# Patient Record
Sex: Female | Born: 1985 | Race: White | Hispanic: No | Marital: Married | Smoking: Never smoker
Health system: Southern US, Community
[De-identification: ages and names within clinical notes are randomized; demographics above are authoritative.]

## PROBLEM LIST (undated history)

## (undated) ENCOUNTER — Inpatient Hospital Stay (HOSPITAL_COMMUNITY): Payer: Self-pay

## (undated) DIAGNOSIS — O0990 Supervision of high risk pregnancy, unspecified, unspecified trimester: Secondary | ICD-10-CM

## (undated) DIAGNOSIS — O24419 Gestational diabetes mellitus in pregnancy, unspecified control: Secondary | ICD-10-CM

## (undated) DIAGNOSIS — E039 Hypothyroidism, unspecified: Secondary | ICD-10-CM

## (undated) DIAGNOSIS — O2620 Pregnancy care for patient with recurrent pregnancy loss, unspecified trimester: Secondary | ICD-10-CM

## (undated) HISTORY — DX: Supervision of high risk pregnancy, unspecified, unspecified trimester: O09.90

## (undated) HISTORY — DX: Pregnancy care for patient with recurrent pregnancy loss, unspecified trimester: O26.20

## (undated) HISTORY — PX: DILATION AND CURETTAGE OF UTERUS: SHX78

---

## 2015-02-23 ENCOUNTER — Encounter (HOSPITAL_COMMUNITY): Payer: Self-pay | Admitting: Emergency Medicine

## 2015-02-23 ENCOUNTER — Emergency Department (HOSPITAL_COMMUNITY)
Admission: EM | Admit: 2015-02-23 | Discharge: 2015-02-23 | Disposition: A | Discharge status: Home / Self Care | Payer: BLUE CROSS/BLUE SHIELD | Source: Home / Self Care | Attending: Family Medicine | Admitting: Family Medicine

## 2015-02-23 DIAGNOSIS — J309 Allergic rhinitis, unspecified: Secondary | ICD-10-CM

## 2015-02-23 MED ORDER — FLUTICASONE PROPIONATE 50 MCG/ACT NA SUSP: 2.0000 | Freq: Two times a day (BID) | NASAL | Status: DC

## 2015-02-23 MED ORDER — FEXOFENADINE-PSEUDOEPHED ER 60-120 MG PO TB12: 1.0000 | ORAL_TABLET | Freq: Two times a day (BID) | ORAL | Status: DC

## 2015-02-23 NOTE — ED Provider Notes (Signed)
CSN: [id number]     Arrival date & time 02/23/15  1420 History   First MD Initiated Contact with Patient 02/23/15 1541     Chief Complaint  Patient presents with  . Allergies   (Consider location/radiation/quality/duration/timing/severity/associated sxs/prior Treatment) HPI      29 year old female presents for evaluation of "sneezing problems." Starting 2 weeks ago she has sneezing and stuffy nose. She denies any pain or any cough. She has not taken any medications for this problem. No systemic symptoms  History reviewed. No pertinent past medical history. History reviewed. No pertinent past surgical history. No family history on file. History  Substance Use Topics  . Smoking status: Not on file  . Smokeless tobacco: Not on file  . Alcohol Use: No   OB History    No data available     Review of Systems  HENT: Positive for congestion and sneezing.   All other systems reviewed and are negative.   Allergies  Review of patient's allergies indicates no known allergies.  Home Medications   Prior to Admission medications   Medication Sig Start Date End Date Taking? Authorizing Provider  fexofenadine-pseudoephedrine (ALLEGRA-D) 60-120 MG per tablet Take 1 tablet by mouth every 12 (twelve) hours. 02/23/15   Adrian Blackwater Egidio Lofgren, PA-C  fluticasone (FLONASE) 50 MCG/ACT nasal spray Place 2 sprays into both nostrils 2 (two) times daily. Decrease to 2 sprays/nostril daily after 5 days 02/23/15   Graylon Good, PA-C   BP 112/85 mmHg  Pulse 84  Temp(Src) 97.9 F (36.6 C) (Oral)  Resp 16  SpO2 96%  LMP 02/07/2015 Physical Exam  Constitutional: She is oriented to person, place, and time. Vital signs are normal. She appears well-developed and well-nourished. No distress.  HENT:  Head: Normocephalic and atraumatic.  Right Ear: External ear normal.  Left Ear: External ear normal.  Nose: Mucosal edema present. Right sinus exhibits no maxillary sinus tenderness and no frontal sinus tenderness.  Left sinus exhibits no maxillary sinus tenderness and no frontal sinus tenderness.  Mouth/Throat: Oropharynx is clear and moist. No oropharyngeal exudate.  Neck: Normal range of motion. Neck supple.  Pulmonary/Chest: Effort normal. No respiratory distress.  Neurological: She is alert and oriented to person, place, and time. She has normal strength. Coordination normal.  Skin: Skin is warm and dry. No rash noted. She is not diaphoretic.  Psychiatric: She has a normal mood and affect. Judgment normal.  Nursing note and vitals reviewed.   ED Course  Procedures (including critical care time) Labs Review Labs Reviewed - No data to display  Imaging Review No results found.   MDM   1. Allergic rhinitis, unspecified allergic rhinitis type    Treat with Flonase and Allegra-D. continue the Flonase for a few months. Follow-up when necessary  Meds ordered this encounter  Medications  . fexofenadine-pseudoephedrine (ALLEGRA-D) 60-120 MG per tablet    Sig: Take 1 tablet by mouth every 12 (twelve) hours.    Dispense:  30 tablet    Refill:  0    Order Specific Question:  Supervising Provider    Answer:  Linna Hoff [phone]  . fluticasone (FLONASE) 50 MCG/ACT nasal spray    Sig: Place 2 sprays into both nostrils 2 (two) times daily. Decrease to 2 sprays/nostril daily after 5 days    Dispense:  16 g    Refill:  2    Order Specific Question:  Supervising Provider    Answer:  Bradd Canary D [[id number]]     Earna Coder  Myrtie NeitherH Leniyah Martell, PA-C 02/23/15 1636

## 2015-02-23 NOTE — Discharge Instructions (Signed)

## 2015-02-23 NOTE — ED Notes (Signed)
Sneezing, stuffy nose for 2 weeks.  Denies cough, denies fever, denies sore throat, denies ear pain

## 2015-03-07 ENCOUNTER — Other Ambulatory Visit (HOSPITAL_COMMUNITY): Payer: Self-pay | Admitting: Obstetrics and Gynecology

## 2015-03-07 DIAGNOSIS — N979 Female infertility, unspecified: Secondary | ICD-10-CM

## 2015-03-14 ENCOUNTER — Ambulatory Visit (HOSPITAL_COMMUNITY)
Admission: RE | Admit: 2015-03-14 | Discharge: 2015-03-14 | Disposition: A | Discharge status: Home / Self Care | Payer: BLUE CROSS/BLUE SHIELD | Source: Ambulatory Visit | Attending: Obstetrics and Gynecology | Admitting: Obstetrics and Gynecology

## 2015-03-14 DIAGNOSIS — N979 Female infertility, unspecified: Secondary | ICD-10-CM | POA: Diagnosis present

## 2015-03-14 MED ORDER — IOHEXOL 300 MG/ML  SOLN
30.0000 mL | Freq: Once | INTRAMUSCULAR | Status: AC | PRN
Start: 2015-03-14 — End: 2015-03-14
  Administered 2015-03-14: 30 mL

## 2015-06-17 ENCOUNTER — Other Ambulatory Visit (HOSPITAL_COMMUNITY): Payer: Self-pay | Admitting: Obstetrics & Gynecology

## 2015-06-17 DIAGNOSIS — N979 Female infertility, unspecified: Secondary | ICD-10-CM

## 2015-06-18 ENCOUNTER — Other Ambulatory Visit (HOSPITAL_COMMUNITY): Payer: Self-pay | Admitting: Obstetrics & Gynecology

## 2015-06-18 ENCOUNTER — Ambulatory Visit (HOSPITAL_COMMUNITY)
Admission: RE | Admit: 2015-06-18 | Discharge: 2015-06-18 | Disposition: A | Discharge status: Home / Self Care | Payer: BLUE CROSS/BLUE SHIELD | Source: Ambulatory Visit | Attending: Obstetrics & Gynecology | Admitting: Obstetrics & Gynecology

## 2015-06-18 DIAGNOSIS — N979 Female infertility, unspecified: Secondary | ICD-10-CM | POA: Insufficient documentation

## 2015-06-19 ENCOUNTER — Ambulatory Visit (HOSPITAL_COMMUNITY)
Admission: RE | Admit: 2015-06-19 | Discharge: 2015-06-19 | Disposition: A | Discharge status: Home / Self Care | Payer: BLUE CROSS/BLUE SHIELD | Source: Ambulatory Visit | Attending: Obstetrics & Gynecology | Admitting: Obstetrics & Gynecology

## 2015-06-19 DIAGNOSIS — N979 Female infertility, unspecified: Secondary | ICD-10-CM | POA: Diagnosis not present

## 2015-06-20 ENCOUNTER — Ambulatory Visit (HOSPITAL_COMMUNITY)
Admission: RE | Admit: 2015-06-20 | Discharge: 2015-06-20 | Disposition: A | Discharge status: Home / Self Care | Payer: BLUE CROSS/BLUE SHIELD | Source: Ambulatory Visit | Attending: Obstetrics and Gynecology | Admitting: Obstetrics and Gynecology

## 2015-06-20 ENCOUNTER — Other Ambulatory Visit: Payer: Self-pay

## 2015-06-20 DIAGNOSIS — N970 Female infertility associated with anovulation: Secondary | ICD-10-CM

## 2016-02-11 LAB — OB RESULTS CONSOLE GC/CHLAMYDIA
Chlamydia: NEGATIVE
Gonorrhea: NEGATIVE

## 2016-02-11 LAB — OB RESULTS CONSOLE ABO/RH: RH TYPE: POSITIVE

## 2016-02-11 LAB — OB RESULTS CONSOLE HGB/HCT, BLOOD
HCT: 36 %
Hemoglobin: 11.9 g/dL

## 2016-02-11 LAB — OB RESULTS CONSOLE RPR: RPR: NONREACTIVE

## 2016-02-11 LAB — OB RESULTS CONSOLE PLATELET COUNT: Platelets: 149 10*3/uL

## 2016-02-11 LAB — OB RESULTS CONSOLE HEPATITIS B SURFACE ANTIGEN: HEP B S AG: NEGATIVE

## 2016-02-11 LAB — OB RESULTS CONSOLE HIV ANTIBODY (ROUTINE TESTING): HIV: NONREACTIVE

## 2016-02-11 LAB — OB RESULTS CONSOLE RUBELLA ANTIBODY, IGM: RUBELLA: IMMUNE

## 2016-02-11 LAB — OB RESULTS CONSOLE ANTIBODY SCREEN: ANTIBODY SCREEN: NEGATIVE

## 2016-02-28 ENCOUNTER — Encounter (HOSPITAL_COMMUNITY): Payer: Self-pay

## 2016-02-28 ENCOUNTER — Ambulatory Visit (HOSPITAL_COMMUNITY)
Admission: RE | Admit: 2016-02-28 | Discharge: 2016-02-28 | Disposition: A | Discharge status: Home / Self Care | Payer: BLUE CROSS/BLUE SHIELD | Source: Ambulatory Visit | Attending: Obstetrics & Gynecology | Admitting: Obstetrics & Gynecology

## 2016-02-28 DIAGNOSIS — O99282 Endocrine, nutritional and metabolic diseases complicating pregnancy, second trimester: Secondary | ICD-10-CM | POA: Diagnosis not present

## 2016-02-28 DIAGNOSIS — O2620 Pregnancy care for patient with recurrent pregnancy loss, unspecified trimester: Secondary | ICD-10-CM | POA: Insufficient documentation

## 2016-02-28 DIAGNOSIS — Z3A14 14 weeks gestation of pregnancy: Secondary | ICD-10-CM | POA: Insufficient documentation

## 2016-02-28 DIAGNOSIS — E039 Hypothyroidism, unspecified: Secondary | ICD-10-CM | POA: Insufficient documentation

## 2016-02-28 HISTORY — DX: Hypothyroidism, unspecified: E03.9

## 2016-02-28 NOTE — Progress Notes (Signed)
MATERNAL FETAL MEDICINE CONSULT  Patient Name: Ashley Rhodes Medical Record Number:  [id number] Date of Birth: 04/04/1986 Requesting Physician Name:  Hoover Browns, MD Date of Service: 02/28/2016  Chief Complaint Recurrent spontaneous abortions  History of Present Illness Ashley Rhodes was seen today secondary to multiple spontaneous abortions at the request of Hoover Browns, MD.  The patient is a 30 y.o. G5P0040 at 14 weeks 3 days with an EDC of 11/19/15.  She has had four prior consecutive miscarriages.  She is uncertain of exactly what gestational ages they occurred, but is relatively certain that all of them occurred at 9 weeks or less.  The father of the baby is the same for all of these pregnancies including her current one.  She reports some testing was done in her home country after some of her prior miscarriages, but she is uncertain of the results of those tests and was told that no abnormalities were identified.  She was diagnosed with hypothyroidism in the beginning of this pregnancy and is currently taking 50 mcg of levothyroxine.  She is also has a history of hypercholesterolemia, but is not on medication for this.  She reports not other medical issues or prior surgeries.  She not had any issues or complications during this pregnancy so far.  Review of Systems Pertinent items are noted in HPI.  Patient History OB History  Gravida Para Term Preterm AB SAB TAB Ectopic Multiple Living  1             # Outcome Date GA Lbr Len/2nd Weight Sex Delivery Anes PTL Lv  1 Current               Past Medical History  Diagnosis Date  . Hypothyroidism     History reviewed. No pertinent past surgical history.  Social History   Social History  . Marital Status: Married    Spouse Name: N/A  . Number of Children: N/A  . Years of Education: N/A   Social History Main Topics  . Smoking status: Never Smoker   . Smokeless tobacco: Never Used  . Alcohol Use: No  . Drug Use: No   . Sexual Activity: Not Asked   Other Topics Concern  . None   Social History Narrative    History reviewed. No pertinent family history. In addition, the patient has no family history of mental retardation, birth defects, or genetic diseases.  Physical Examination Filed Vitals:   02/28/16 1021  BP: 114/74  Pulse: 86   General appearance - alert, well appearing, and in no distress Mental status - alert, oriented to person, place, and time  Assessment and Recommendations 1.  Recurrent miscarriages.  Ashley Rhodes has had 4 consecutive first trimester miscarriages.  I discussed possible etiologies for recurrent losses including parental karyotype abnormalities (such as balanced translocations), uterine anomalies, medical comorbidities such as diabetes or thyroid disease, and anti-phospholipid syndrome.  Although large abnormalities of the uterus can be detected on ultrasound during pregnancy, more subtle abnormalities cannot be excluded during pregnancy.  Typically a non-gravid 3D ultrasound or MRI is needed to definitively exclude such an anomaly.  She has already been diagnosed with hypothyroidism and is receiving appropriate treatment.  The remainder of the causes discussed can be screened for with blood testing which would include parental karyotype, a one hour glucose tolerance test for diabetes, and anti-cardiolipin anti-bodies, anti-beta-2-microglobulin antibodies, and lupus anticoagulant for anti-phospholipid syndrome.  We also dicussed the potential treatments for diabetes (oral hypoglycemics  or insulin, thyroid disease (levothyroxine for hypothyroidism and propylthiouracil or methimazole for hyperthyroidism), and anti-phospholipid syndrome (prophylactic low-molecular weight and a baby aspirin) that could reduce the risk of a miscarriage in this pregnancy should and of these problems be confirmed.  I reviewed the diagnostic yield of these studies with Ashley Rhodes and her  mother, and indicated that a definitive cause for recurrent miscarriage is discovered in only a minority of cases, and most likely all of her tests will be normal, in which case no specific treatments are indicated for prevention of another loss in this pregnancy.  Ms. Madaline Savageokharel Rhodes decided to not to proceed with the above mentioned testing today, preferring to consider the matter further before proceeding.  She will contact her primary OB if she decides to have the testing performed.   I spent 30 minutes with Ashley Rhodes today of which 50% was face-to-face counseling.  [facility].  Please do not hesitate to contact us with questions.   Rema FendtNITSCHE,Avo Schlachter, MD

## 2016-04-18 ENCOUNTER — Encounter: Payer: Self-pay | Admitting: *Deleted

## 2016-05-01 ENCOUNTER — Encounter: Payer: Self-pay | Admitting: *Deleted

## 2016-05-06 ENCOUNTER — Ambulatory Visit (INDEPENDENT_AMBULATORY_CARE_PROVIDER_SITE_OTHER): Payer: Medicaid Other | Admitting: Advanced Practice Midwife

## 2016-05-06 ENCOUNTER — Encounter: Payer: Self-pay | Admitting: Advanced Practice Midwife

## 2016-05-06 VITALS — BP 125/72 | HR 89 | Ht 59.0 in | Wt 150.4 lb

## 2016-05-06 DIAGNOSIS — E039 Hypothyroidism, unspecified: Secondary | ICD-10-CM | POA: Diagnosis not present

## 2016-05-06 DIAGNOSIS — Z98891 History of uterine scar from previous surgery: Secondary | ICD-10-CM | POA: Insufficient documentation

## 2016-05-06 DIAGNOSIS — O99282 Endocrine, nutritional and metabolic diseases complicating pregnancy, second trimester: Secondary | ICD-10-CM | POA: Diagnosis present

## 2016-05-06 DIAGNOSIS — O99280 Endocrine, nutritional and metabolic diseases complicating pregnancy, unspecified trimester: Secondary | ICD-10-CM

## 2016-05-06 DIAGNOSIS — N856 Intrauterine synechiae: Secondary | ICD-10-CM | POA: Insufficient documentation

## 2016-05-06 DIAGNOSIS — O0990 Supervision of high risk pregnancy, unspecified, unspecified trimester: Secondary | ICD-10-CM

## 2016-05-06 DIAGNOSIS — O0992 Supervision of high risk pregnancy, unspecified, second trimester: Secondary | ICD-10-CM

## 2016-05-06 LAB — POCT URINALYSIS DIP (DEVICE)
Bilirubin Urine: NEGATIVE
Glucose, UA: NEGATIVE mg/dL
HGB URINE DIPSTICK: NEGATIVE
KETONES UR: NEGATIVE mg/dL
Leukocytes, UA: NEGATIVE
Nitrite: NEGATIVE
PROTEIN: NEGATIVE mg/dL
SPECIFIC GRAVITY, URINE: 1.01 (ref 1.005–1.030)
Urobilinogen, UA: 0.2 mg/dL (ref 0.0–1.0)
pH: 6.5 (ref 5.0–8.0)

## 2016-05-06 NOTE — Progress Notes (Signed)
   Subjective:    Janisha Madaline Savageokharel Khanal is a G5P0040 5874w1d being seen today for her first obstetrical visit.  Her obstetrical history is significant for Hypothyroidism, Left uterine synechiae. Patient does intend to breast feed. Pregnancy history fully reviewed.  Patient reports backache and fatigue.  Filed Vitals:   05/06/16 0852 05/06/16 0856  BP: 125/72   Pulse: 89   Height:  4\' 11"  (1.499 m)  Weight: 150 lb 6.4 oz (68.221 kg)     HISTORY: OB History  Gravida Para Term Preterm AB SAB TAB Ectopic Multiple Living  5    4 4         # Outcome Date GA Lbr Len/2nd Weight Sex Delivery Anes PTL Lv  5 Current           4 SAB           3 SAB           2 SAB           1 SAB              Past Medical History  Diagnosis Date  . Hypothyroidism   . Four previous spontaneous abortions (SAB) affecting care of mother, antepartum    Past Surgical History  Procedure Laterality Date  . Dilation and curettage of uterus     Family History  Problem Relation Age of Onset  . Diabetes Mother   . Diabetes Father      Exam    Uterus:     Pelvic Exam:    Perineum: Not examined, [facility]   Vulva: N/a    Vagina:  n/a   pH:    Cervix: n/a   Adnexa: not evaluated   Bony Pelvis: gynecoid  System: Breast:  normal appearance, no masses or tenderness   Skin: normal coloration and turgor, no rashes    Neurologic: oriented, grossly non-focal   Extremities: normal strength, tone, and muscle mass   HEENT neck supple with midline trachea   Mouth/Teeth Not examined   Neck supple   Cardiovascular: regular rate and rhythm   Respiratory:  appears well, vitals normal, no respiratory distress, acyanotic, normal RR, ear and throat exam is normal, neck free of mass or lymphadenopathy, chest clear, no wheezing, crepitations, rhonchi, normal symmetric air entry   Abdomen: soft, non-tender; bowel sounds normal; no masses,  no organomegaly   Urinary: n/a       Assessment:    Pregnancy: G5P0040 Patient Active Problem List   Diagnosis Date Noted  . 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 Hypothyroidism 05/06/2016  . Supervision of high-risk pregnancy 05/06/2016        Plan:     Initial labs drawn. Prenatal vitamins. Problem list reviewed and updated. Genetic Screening discussed Quad Screen: declined.  Ultrasound discussed; fetal survey: ordered.  Follow up in 4 weeks. 50% of 30 min visit spent on counseling and coordination of care.   Routines reviewed Welcomed to practice Will check TSH today and q trimester US ordered to follow growth due to synechiae  Early glucola  [facility] Hospital[facility]ILLIAMS,Marranda Arakelian 05/06/2016

## 2016-05-06 NOTE — Progress Notes (Signed)
Pt c/o leg cramps @ night

## 2016-05-06 NOTE — Patient Instructions (Signed)
Second Trimester of Pregnancy The second trimester is from week 13 through week 28, months 4 through 6. The second trimester is often a time when you feel your best. Your body has also adjusted to being pregnant, and you begin to feel better physically. Usually, morning sickness has lessened or quit completely, you may have more energy, and you may have an increase in appetite. The second trimester is also a time when the fetus is growing rapidly. At the end of the sixth month, the fetus is about 9 inches long and weighs about 1 pounds. You will likely begin to feel the baby move (quickening) between 18 and 20 weeks of the pregnancy. BODY CHANGES Your body goes through many changes during pregnancy. The changes vary from woman to woman.   Your weight will continue to increase. You will notice your lower abdomen bulging out.  You may begin to get stretch marks on your hips, abdomen, and breasts.  You may develop headaches that can be relieved by medicines approved by your health care provider.  You may urinate more often because the fetus is pressing on your bladder.  You may develop or continue to have heartburn as a result of your pregnancy.  You may develop constipation because certain hormones are causing the muscles that push waste through your intestines to slow down.  You may develop hemorrhoids or swollen, bulging veins (varicose veins).  You may have back pain because of the weight gain and pregnancy hormones relaxing your joints between the bones in your pelvis and as a result of a shift in weight and the muscles that support your balance.  Your breasts will continue to grow and be tender.  Your gums may bleed and may be sensitive to brushing and flossing.  Dark spots or blotches (chloasma, mask of pregnancy) may develop on your face. This will likely fade after the baby is born.  A dark line from your belly button to the pubic area (linea nigra) may appear. This will likely  fade after the baby is born.  You may have changes in your hair. These can include thickening of your hair, rapid growth, and changes in texture. Some women also have hair loss during or after pregnancy, or hair that feels dry or thin. Your hair will most likely return to normal after your baby is born. WHAT TO EXPECT AT YOUR PRENATAL VISITS During a routine prenatal visit:  You will be weighed to make sure you and the fetus are growing normally.  Your blood pressure will be taken.  Your abdomen will be measured to track your baby's growth.  The fetal heartbeat will be listened to.  Any test results from the previous visit will be discussed. Your health care provider may ask you:  How you are feeling.  If you are feeling the baby move.  If you have had any abnormal symptoms, such as leaking fluid, bleeding, severe headaches, or abdominal cramping.  If you are using any tobacco products, including cigarettes, chewing tobacco, and electronic cigarettes.  If you have any questions. Other tests that may be performed during your second trimester include:  Blood tests that check for:  Low iron levels (anemia).  Gestational diabetes (between 24 and 28 weeks).  Rh antibodies.  Urine tests to check for infections, diabetes, or protein in the urine.  An ultrasound to confirm the proper growth and development of the baby.  An amniocentesis to check for possible genetic problems.  Fetal screens for spina bifida   and Down syndrome.  HIV (human immunodeficiency virus) testing. Routine prenatal testing includes screening for HIV, unless you choose not to have this test. HOME CARE INSTRUCTIONS   Avoid all smoking, herbs, alcohol, and unprescribed drugs. These chemicals affect the formation and growth of the baby.  Do not use any tobacco products, including cigarettes, chewing tobacco, and electronic cigarettes. If you need help quitting, ask your health care provider. You may receive  counseling support and other resources to help you quit.  Follow your health care provider's instructions regarding medicine use. There are medicines that are either safe or unsafe to take during pregnancy.  Exercise only as directed by your health care provider. Experiencing uterine cramps is a good sign to stop exercising.  Continue to eat regular, healthy meals.  Wear a good support bra for breast tenderness.  Do not use hot tubs, steam rooms, or saunas.  Wear your seat belt at all times when driving.  Avoid raw meat, uncooked cheese, cat litter boxes, and soil used by cats. These carry germs that can cause birth defects in the baby.  Take your prenatal vitamins.  Take 1500-2000 mg of calcium daily starting at the 20th week of pregnancy until you deliver your baby.  Try taking a stool softener (if your health care provider approves) if you develop constipation. Eat more high-fiber foods, such as fresh vegetables or fruit and whole grains. Drink plenty of fluids to keep your urine clear or pale yellow.  Take warm sitz baths to soothe any pain or discomfort caused by hemorrhoids. Use hemorrhoid cream if your health care provider approves.  If you develop varicose veins, wear support hose. Elevate your feet for 15 minutes, 3-4 times a day. Limit salt in your diet.  Avoid heavy lifting, wear low heel shoes, and practice good posture.  Rest with your legs elevated if you have leg cramps or low back pain.  Visit your dentist if you have not gone yet during your pregnancy. Use a soft toothbrush to brush your teeth and be gentle when you floss.  A sexual relationship may be continued unless your health care provider directs you otherwise.  Continue to go to all your prenatal visits as directed by your health care provider. SEEK MEDICAL CARE IF:   You have dizziness.  You have mild pelvic cramps, pelvic pressure, or nagging pain in the abdominal area.  You have persistent nausea,  vomiting, or diarrhea.  You have a bad smelling vaginal discharge.  You have pain with urination. SEEK IMMEDIATE MEDICAL CARE IF:   You have a fever.  You are leaking fluid from your vagina.  You have spotting or bleeding from your vagina.  You have severe abdominal cramping or pain.  You have rapid weight gain or loss.  You have shortness of breath with chest pain.  You notice sudden or extreme swelling of your face, hands, ankles, feet, or legs.  You have not felt your baby move in over an hour.  You have severe headaches that do not go away with medicine.  You have vision changes.   This information is not intended to replace advice given to you by your health care provider. Make sure you discuss any questions you have with your health care provider.   Document Released: 10/06/2001 Document Revised: 11/02/2014 Document Reviewed: 12/13/2012 [employer] Interactive Patient Education 2016 [employer]  Contraception Choices Contraception (birth control) is the use of any methods or devices to prevent pregnancy. Below are some methods to   help avoid pregnancy. HORMONAL METHODS   Contraceptive implant. This is a thin, plastic tube containing progesterone hormone. It does not contain estrogen hormone. Your health care provider inserts the tube in the inner part of the upper arm. The tube can remain in place for up to 3 years. After 3 years, the implant must be removed. The implant prevents the ovaries from releasing an egg (ovulation), thickens the cervical mucus to prevent sperm from entering the uterus, and thins the lining of the inside of the uterus.  Progesterone-only injections. These injections are given every 3 months by your health care provider to prevent pregnancy. This synthetic progesterone hormone stops the ovaries from releasing eggs. It also thickens cervical mucus and changes the uterine lining. This makes it harder for sperm to survive in the uterus.  Birth  control pills. These pills contain estrogen and progesterone hormone. They work by preventing the ovaries from releasing eggs (ovulation). They also cause the cervical mucus to thicken, preventing the sperm from entering the uterus. Birth control pills are prescribed by a health care provider.Birth control pills can also be used to treat heavy periods.  Minipill. This type of birth control pill contains only the progesterone hormone. They are taken every day of each month and must be prescribed by your health care provider.  Birth control patch. The patch contains hormones similar to those in birth control pills. It must be changed once a week and is prescribed by a health care provider.  Vaginal ring. The ring contains hormones similar to those in birth control pills. It is left in the vagina for 3 weeks, removed for 1 week, and then a new one is put back in place. The patient must be comfortable inserting and removing the ring from the vagina.A health care provider's prescription is necessary.  Emergency contraception. Emergency contraceptives prevent pregnancy after unprotected sexual intercourse. This pill can be taken right after sex or up to 5 days after unprotected sex. It is most effective the sooner you take the pills after having sexual intercourse. Most emergency contraceptive pills are available without a prescription. Check with your pharmacist. Do not use emergency contraception as your only form of birth control. BARRIER METHODS   Female condom. This is a thin sheath (latex or rubber) that is worn over the penis during sexual intercourse. It can be used with spermicide to increase effectiveness.  Female condom. This is a soft, loose-fitting sheath that is put into the vagina before sexual intercourse.  Diaphragm. This is a soft, latex, dome-shaped barrier that must be fitted by a health care provider. It is inserted into the vagina, along with a spermicidal jelly. It is inserted before  intercourse. The diaphragm should be left in the vagina for 6 to 8 hours after intercourse.  Cervical cap. This is a round, soft, latex or plastic cup that fits over the cervix and must be fitted by a health care provider. The cap can be left in place for up to 48 hours after intercourse.  Sponge. This is a soft, circular piece of polyurethane foam. The sponge has spermicide in it. It is inserted into the vagina after wetting it and before sexual intercourse.  Spermicides. These are chemicals that kill or block sperm from entering the cervix and uterus. They come in the form of creams, jellies, suppositories, foam, or tablets. They do not require a prescription. They are inserted into the vagina with an applicator before having sexual intercourse. The process must be repeated every   time you have sexual intercourse. INTRAUTERINE CONTRACEPTION  Intrauterine device (IUD). This is a T-shaped device that is put in a woman's uterus during a menstrual period to prevent pregnancy. There are 2 types:  Copper IUD. This type of IUD is wrapped in copper wire and is placed inside the uterus. Copper makes the uterus and fallopian tubes produce a fluid that kills sperm. It can stay in place for 10 years.  Hormone IUD. This type of IUD contains the hormone progestin (synthetic progesterone). The hormone thickens the cervical mucus and prevents sperm from entering the uterus, and it also thins the uterine lining to prevent implantation of a fertilized egg. The hormone can weaken or kill the sperm that get into the uterus. It can stay in place for 3-5 years, depending on which type of IUD is used. PERMANENT METHODS OF CONTRACEPTION  Female tubal ligation. This is when the woman's fallopian tubes are surgically sealed, tied, or blocked to prevent the egg from traveling to the uterus.  Hysteroscopic sterilization. This involves placing a small coil or insert into each fallopian tube. Your doctor uses a technique  called hysteroscopy to do the procedure. The device causes scar tissue to form. This results in permanent blockage of the fallopian tubes, so the sperm cannot fertilize the egg. It takes about 3 months after the procedure for the tubes to become blocked. You must use another form of birth control for these 3 months.  Female sterilization. This is when the female has the tubes that carry sperm tied off (vasectomy).This blocks sperm from entering the vagina during sexual intercourse. After the procedure, the man can still ejaculate fluid (semen). NATURAL PLANNING METHODS  Natural family planning. This is not having sexual intercourse or using a barrier method (condom, diaphragm, cervical cap) on days the woman could become pregnant.  Calendar method. This is keeping track of the length of each menstrual cycle and identifying when you are fertile.  Ovulation method. This is avoiding sexual intercourse during ovulation.  Symptothermal method. This is avoiding sexual intercourse during ovulation, using a thermometer and ovulation symptoms.  Post-ovulation method. This is timing sexual intercourse after you have ovulated. Regardless of which type or method of contraception you choose, it is important that you use condoms to protect against the transmission of sexually transmitted infections (STIs). Talk with your health care provider about which form of contraception is most appropriate for you.   This information is not intended to replace advice given to you by your health care provider. Make sure you discuss any questions you have with your health care provider.   Document Released: 10/12/2005 Document Revised: 10/17/2013 Document Reviewed: 04/06/2013 [employer] Interactive Patient Education [employer]

## 2016-05-07 LAB — GLUCOSE TOLERANCE, 1 HOUR (50G) W/O FASTING: Glucose, 1 Hr, gestational: 134 mg/dL (ref <140)

## 2016-05-07 LAB — TSH: TSH: 2.2 mIU/L

## 2016-05-14 ENCOUNTER — Encounter (HOSPITAL_COMMUNITY): Payer: Self-pay

## 2016-05-14 ENCOUNTER — Other Ambulatory Visit: Payer: Self-pay | Admitting: Advanced Practice Midwife

## 2016-05-14 ENCOUNTER — Ambulatory Visit (HOSPITAL_COMMUNITY)
Admission: RE | Admit: 2016-05-14 | Discharge: 2016-05-14 | Disposition: A | Discharge status: Home / Self Care | Payer: Medicaid Other | Source: Ambulatory Visit | Attending: Advanced Practice Midwife | Admitting: Advanced Practice Midwife

## 2016-05-14 VITALS — BP 98/58 | HR 86 | Wt 152.1 lb

## 2016-05-14 DIAGNOSIS — Z3A25 25 weeks gestation of pregnancy: Secondary | ICD-10-CM

## 2016-05-14 DIAGNOSIS — O0990 Supervision of high risk pregnancy, unspecified, unspecified trimester: Secondary | ICD-10-CM

## 2016-05-14 DIAGNOSIS — O0992 Supervision of high risk pregnancy, unspecified, second trimester: Secondary | ICD-10-CM

## 2016-05-14 DIAGNOSIS — Z36 Encounter for antenatal screening of mother: Secondary | ICD-10-CM | POA: Insufficient documentation

## 2016-05-14 DIAGNOSIS — O2622 Pregnancy care for patient with recurrent pregnancy loss, second trimester: Secondary | ICD-10-CM | POA: Insufficient documentation

## 2016-05-15 ENCOUNTER — Other Ambulatory Visit (HOSPITAL_COMMUNITY): Payer: Self-pay | Admitting: *Deleted

## 2016-05-15 DIAGNOSIS — Z0489 Encounter for examination and observation for other specified reasons: Secondary | ICD-10-CM

## 2016-05-15 DIAGNOSIS — IMO0002 Reserved for concepts with insufficient information to code with codable children: Secondary | ICD-10-CM

## 2016-05-20 ENCOUNTER — Encounter: Payer: BLUE CROSS/BLUE SHIELD | Admitting: Family

## 2016-05-24 ENCOUNTER — Encounter (HOSPITAL_COMMUNITY): Payer: Self-pay | Admitting: *Deleted

## 2016-05-24 ENCOUNTER — Inpatient Hospital Stay (HOSPITAL_COMMUNITY)
Admission: AD | Admit: 2016-05-24 | Discharge: 2016-05-24 | Disposition: A | Discharge status: Home / Self Care | Payer: No Typology Code available for payment source | Source: Ambulatory Visit | Attending: Family Medicine | Admitting: Family Medicine

## 2016-05-24 DIAGNOSIS — Z3689 Encounter for other specified antenatal screening: Secondary | ICD-10-CM

## 2016-05-24 DIAGNOSIS — O26892 Other specified pregnancy related conditions, second trimester: Secondary | ICD-10-CM | POA: Diagnosis not present

## 2016-05-24 DIAGNOSIS — Z3492 Encounter for supervision of normal pregnancy, unspecified, second trimester: Secondary | ICD-10-CM

## 2016-05-24 DIAGNOSIS — E039 Hypothyroidism, unspecified: Secondary | ICD-10-CM | POA: Insufficient documentation

## 2016-05-24 DIAGNOSIS — Z3A26 26 weeks gestation of pregnancy: Secondary | ICD-10-CM | POA: Insufficient documentation

## 2016-05-24 DIAGNOSIS — Z79899 Other long term (current) drug therapy: Secondary | ICD-10-CM | POA: Insufficient documentation

## 2016-05-24 DIAGNOSIS — O368120 Decreased fetal movements, second trimester, not applicable or unspecified: Secondary | ICD-10-CM | POA: Diagnosis not present

## 2016-05-24 DIAGNOSIS — O0992 Supervision of high risk pregnancy, unspecified, second trimester: Secondary | ICD-10-CM

## 2016-05-24 DIAGNOSIS — O99282 Endocrine, nutritional and metabolic diseases complicating pregnancy, second trimester: Secondary | ICD-10-CM | POA: Insufficient documentation

## 2016-05-24 NOTE — Discharge Instructions (Signed)
What Do I Need to Know About Injuries During Pregnancy? °Trauma is the most common cause of injury and death in pregnant women. This can also result in significant harm or death of the baby. °Your baby is protected in the womb (uterus) by a sac filled with fluid (amniotic sac). Your baby can be harmed if there is direct, high-impact trauma to your abdomen and pelvis. This type of trauma can result in tearing of your uterus, the placenta pulling away from the wall of the uterus (placenta abruption), or the amniotic sac breaking open (rupture of membranes). These injuries can decrease or stop the blood supply to your baby or cause you to go into labor earlier than expected. Minor falls and low-impact automobile accidents do not usually harm your baby, even if they do minimally harm you. °WHAT KIND OF INJURIES CAN AFFECT MY PREGNANCY? °The most common causes of injury or death to a baby include: °· Falls. Falls are more common in the second and third trimester of the pregnancy. Factors that increase your risk of falling include: °¨ Increase in your weight. °¨ The change in your center of gravity. °¨ Tripping over an object that cannot be seen. °¨ Increased looseness (laxity) of your ligaments resulting in less coordinated movements (you may feel clumsy). °¨ Falling during high-risk activities like horseback riding or skiing. °· Automobile accidents. It is important to wear your seat belt properly, with the lap belt below your abdomen, and always practice safe driving. °· Domestic violence or assault. °· Burns (fire or electrical). °The most common causes of injury or death to the pregnant woman include: °· Injuries that cause severe bleeding, shock, and loss of blood flow to major organs. °· Head and neck injuries that result in severe brain or spinal damage. °· Chest trauma that can cause direct injury to the heart and lungs or any injury that affects the area enclosed by the ribs. Trauma to this area can result in  cardiorespiratory arrest. °WHAT CAN I DO TO PROTECT MYSELF AND MY BABY FROM INJURY WHILE I AM PREGNANT? °· Remove slippery rugs and loose objects on the floor that increase your risk of tripping. °· Avoid walking on wet or slippery floors. °· Wear comfortable shoes that have a good grip on the sole. Do not wear high-heeled shoes. °· Always wear your seat belt properly, with the lap belt below your abdomen, and always practice safe driving. Do not ride on a motorcycle while pregnant. °· Do not participate in high-impact activities or sports. °· Avoid fires, starting fires, lifting heavy pots of boiling or hot liquids, and fixing electrical problems. °· Only take over-the-counter or prescription medicines for pain, fever, or discomfort as directed by your health care provider. °· Know your blood type and the father's blood type in case you develop vaginal bleeding or experience an injury for which a blood transfusion may be necessary. °· Call your local emergency services (911 in the [place] if you are a victim of domestic violence or assault. Spousal abuse can be a significant cause of trauma during pregnancy. For help and support, contact the National Domestic Violence Hotline. °WHEN SHOULD I SEEK IMMEDIATE MEDICAL CARE?  °· You fall on your abdomen or experience any high-force accident or injury. °· You have been assaulted (domestic or otherwise). °· You have been in a car accident. °· You develop vaginal bleeding. °· You develop fluid leaking from the vagina. °· You develop uterine contractions (pelvic cramping, pain, or significant low back   pain).  You become weak or faint, or have uncontrolled vomiting after trauma.  You had a serious burn. This includes burns to the face, neck, hands, or genitals, or burns greater than the size of your palm anywhere else.  You develop neck stiffness or pain after a fall or from other trauma.  You develop a headache or vision problems after a fall or from other  trauma.  You do not feel the baby moving or the baby is not moving as much as before a fall or other trauma.   This information is not intended to replace advice given to you by your health care provider. Make sure you discuss any questions you have with your health care provider.   Document Released: 11/19/2004 Document Revised: 11/02/2014 Document Reviewed: 07/19/2013 Elsevier Interactive Patient Education 2016 [employer]  Fetal Movement Counts Patient Name: __________________________________________________ Patient Due Date: ____________________ Performing a fetal movement count is highly recommended in high-risk pregnancies, but it is good for every pregnant woman to do. Your health care provider may ask you to start counting fetal movements at 28 weeks of the pregnancy. Fetal movements often increase:  After eating a full meal.  After physical activity.  After eating or drinking something sweet or cold.  At rest. Pay attention to when you feel the baby is most active. This will help you notice a pattern of your baby's sleep and wake cycles and what factors contribute to an increase in fetal movement. It is important to perform a fetal movement count at the same time each day when your baby is normally most active.  HOW TO COUNT FETAL MOVEMENTS 1. Find a quiet and comfortable area to sit or lie down on your left side. Lying on your left side provides the best blood and oxygen circulation to your baby. 2. Write down the day and time on a sheet of paper or in a journal. 3. Start counting kicks, flutters, swishes, rolls, or jabs in a 2-hour period. You should feel at least 10 movements within 2 hours. 4. If you do not feel 10 movements in 2 hours, wait 2-3 hours and count again. Look for a change in the pattern or not enough counts in 2 hours. SEEK MEDICAL CARE IF:  You feel less than 10 counts in 2 hours, tried twice.  There is no movement in over an hour.  The pattern is  changing or taking longer each day to reach 10 counts in 2 hours.  You feel the baby is not moving as he or she usually does. Date: ____________ Movements: ____________ Start time: ____________ Doreatha Martin time: ____________  Date: ____________ Movements: ____________ Start time: ____________ Doreatha Martin time: ____________ Date: ____________ Movements: ____________ Start time: ____________ Doreatha Martin time: ____________ Date: ____________ Movements: ____________ Start time: ____________ Doreatha Martin time: ____________ Date: ____________ Movements: ____________ Start time: ____________ Doreatha Martin time: ____________ Date: ____________ Movements: ____________ Start time: ____________ Doreatha Martin time: ____________ Date: ____________ Movements: ____________ Start time: ____________ Doreatha Martin time: ____________ Date: ____________ Movements: ____________ Start time: ____________ Doreatha Martin time: ____________  Date: ____________ Movements: ____________ Start time: ____________ Doreatha Martin time: ____________ Date: ____________ Movements: ____________ Start time: ____________ Doreatha Martin time: ____________ Date: ____________ Movements: ____________ Start time: ____________ Doreatha Martin time: ____________ Date: ____________ Movements: ____________ Start time: ____________ Doreatha Martin time: ____________ Date: ____________ Movements: ____________ Start time: ____________ Doreatha Martin time: ____________ Date: ____________ Movements: ____________ Start time: ____________ Doreatha Martin time: ____________ Date: ____________ Movements: ____________ Start time: ____________ Doreatha Martin time: ____________  Date: ____________ Movements: ____________ Start time: ____________ Doreatha Martin  time: ____________ Date: ____________ Movements: ____________ Start time: ____________ Elizebeth Koller time: ____________ Date: ____________ Movements: ____________ Start time: ____________ Elizebeth Koller time: ____________ Date: ____________ Movements: ____________ Start time: ____________ Elizebeth Koller time: ____________ Date:  ____________ Movements: ____________ Start time: ____________ Elizebeth Koller time: ____________ Date: ____________ Movements: ____________ Start time: ____________ Elizebeth Koller time: ____________ Date: ____________ Movements: ____________ Start time: ____________ Elizebeth Koller time: ____________  Date: ____________ Movements: ____________ Start time: ____________ Elizebeth Koller time: ____________ Date: ____________ Movements: ____________ Start time: ____________ Elizebeth Koller time: ____________ Date: ____________ Movements: ____________ Start time: ____________ Elizebeth Koller time: ____________ Date: ____________ Movements: ____________ Start time: ____________ Elizebeth Koller time: ____________ Date: ____________ Movements: ____________ Start time: ____________ Elizebeth Koller time: ____________ Date: ____________ Movements: ____________ Start time: ____________ Elizebeth Koller time: ____________ Date: ____________ Movements: ____________ Start time: ____________ Elizebeth Koller time: ____________  Date: ____________ Movements: ____________ Start time: ____________ Elizebeth Koller time: ____________ Date: ____________ Movements: ____________ Start time: ____________ Elizebeth Koller time: ____________ Date: ____________ Movements: ____________ Start time: ____________ Elizebeth Koller time: ____________ Date: ____________ Movements: ____________ Start time: ____________ Elizebeth Koller time: ____________ Date: ____________ Movements: ____________ Start time: ____________ Elizebeth Koller time: ____________ Date: ____________ Movements: ____________ Start time: ____________ Elizebeth Koller time: ____________ Date: ____________ Movements: ____________ Start time: ____________ Elizebeth Koller time: ____________  Date: ____________ Movements: ____________ Start time: ____________ Elizebeth Koller time: ____________ Date: ____________ Movements: ____________ Start time: ____________ Elizebeth Koller time: ____________ Date: ____________ Movements: ____________ Start time: ____________ Elizebeth Koller time: ____________ Date: ____________ Movements: ____________ Start  time: ____________ Elizebeth Koller time: ____________ Date: ____________ Movements: ____________ Start time: ____________ Elizebeth Koller time: ____________ Date: ____________ Movements: ____________ Start time: ____________ Elizebeth Koller time: ____________ Date: ____________ Movements: ____________ Start time: ____________ Elizebeth Koller time: ____________  Date: ____________ Movements: ____________ Start time: ____________ Elizebeth Koller time: ____________ Date: ____________ Movements: ____________ Start time: ____________ Elizebeth Koller time: ____________ Date: ____________ Movements: ____________ Start time: ____________ Elizebeth Koller time: ____________ Date: ____________ Movements: ____________ Start time: ____________ Elizebeth Koller time: ____________ Date: ____________ Movements: ____________ Start time: ____________ Elizebeth Koller time: ____________ Date: ____________ Movements: ____________ Start time: ____________ Elizebeth Koller time: ____________ Date: ____________ Movements: ____________ Start time: ____________ Elizebeth Koller time: ____________  Date: ____________ Movements: ____________ Start time: ____________ Elizebeth Koller time: ____________ Date: ____________ Movements: ____________ Start time: ____________ Elizebeth Koller time: ____________ Date: ____________ Movements: ____________ Start time: ____________ Elizebeth Koller time: ____________ Date: ____________ Movements: ____________ Start time: ____________ Elizebeth Koller time: ____________ Date: ____________ Movements: ____________ Start time: ____________ Elizebeth Koller time: ____________ Date: ____________ Movements: ____________ Start time: ____________ Elizebeth Koller time: ____________   This information is not intended to replace advice given to you by your health care provider. Make sure you discuss any questions you have with your health care provider.   Document Released: 11/11/2006 Document Revised: 11/02/2014 Document Reviewed: 08/08/2012 Elsevier Interactive Patient Education [employer].
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
## 2016-06-04 ENCOUNTER — Encounter (HOSPITAL_COMMUNITY): Payer: Self-pay

## 2016-06-04 ENCOUNTER — Other Ambulatory Visit (HOSPITAL_COMMUNITY): Payer: Self-pay | Admitting: Obstetrics and Gynecology

## 2016-06-04 ENCOUNTER — Ambulatory Visit (HOSPITAL_COMMUNITY)
Admission: RE | Admit: 2016-06-04 | Discharge: 2016-06-04 | Disposition: A | Discharge status: Home / Self Care | Payer: Medicaid Other | Source: Ambulatory Visit | Attending: Advanced Practice Midwife | Admitting: Advanced Practice Midwife

## 2016-06-04 DIAGNOSIS — Z3A28 28 weeks gestation of pregnancy: Secondary | ICD-10-CM

## 2016-06-04 DIAGNOSIS — O0992 Supervision of high risk pregnancy, unspecified, second trimester: Secondary | ICD-10-CM

## 2016-06-04 DIAGNOSIS — Z36 Encounter for antenatal screening of mother: Secondary | ICD-10-CM | POA: Insufficient documentation

## 2016-06-04 DIAGNOSIS — O2623 Pregnancy care for patient with recurrent pregnancy loss, third trimester: Secondary | ICD-10-CM | POA: Diagnosis not present

## 2016-06-04 DIAGNOSIS — O2622 Pregnancy care for patient with recurrent pregnancy loss, second trimester: Secondary | ICD-10-CM

## 2016-06-04 DIAGNOSIS — IMO0002 Reserved for concepts with insufficient information to code with codable children: Secondary | ICD-10-CM

## 2016-06-04 DIAGNOSIS — Z0489 Encounter for examination and observation for other specified reasons: Secondary | ICD-10-CM

## 2016-06-10 ENCOUNTER — Ambulatory Visit (INDEPENDENT_AMBULATORY_CARE_PROVIDER_SITE_OTHER): Payer: Medicaid Other | Admitting: Advanced Practice Midwife

## 2016-06-10 ENCOUNTER — Encounter: Payer: Self-pay | Admitting: Advanced Practice Midwife

## 2016-06-10 VITALS — BP 110/68 | HR 91 | Wt 152.3 lb

## 2016-06-10 DIAGNOSIS — O99613 Diseases of the digestive system complicating pregnancy, third trimester: Secondary | ICD-10-CM | POA: Diagnosis not present

## 2016-06-10 DIAGNOSIS — O0993 Supervision of high risk pregnancy, unspecified, third trimester: Secondary | ICD-10-CM

## 2016-06-10 DIAGNOSIS — O26893 Other specified pregnancy related conditions, third trimester: Secondary | ICD-10-CM

## 2016-06-10 DIAGNOSIS — E039 Hypothyroidism, unspecified: Secondary | ICD-10-CM | POA: Diagnosis not present

## 2016-06-10 DIAGNOSIS — L299 Pruritus, unspecified: Secondary | ICD-10-CM

## 2016-06-10 DIAGNOSIS — O99283 Endocrine, nutritional and metabolic diseases complicating pregnancy, third trimester: Secondary | ICD-10-CM | POA: Diagnosis not present

## 2016-06-10 DIAGNOSIS — O99713 Diseases of the skin and subcutaneous tissue complicating pregnancy, third trimester: Principal | ICD-10-CM

## 2016-06-10 DIAGNOSIS — K5900 Constipation, unspecified: Secondary | ICD-10-CM

## 2016-06-10 LAB — POCT URINALYSIS DIP (DEVICE)
Bilirubin Urine: NEGATIVE
GLUCOSE, UA: NEGATIVE mg/dL
Hgb urine dipstick: NEGATIVE
Ketones, ur: NEGATIVE mg/dL
LEUKOCYTES UA: NEGATIVE
NITRITE: NEGATIVE
Protein, ur: NEGATIVE mg/dL
Specific Gravity, Urine: 1.01 (ref 1.005–1.030)
UROBILINOGEN UA: 0.2 mg/dL (ref 0.0–1.0)
pH: 7 (ref 5.0–8.0)

## 2016-06-10 MED ORDER — HYDROCORTISONE 0.5 % EX CREA: 1.0000 "application " | TOPICAL_CREAM | Freq: Two times a day (BID) | CUTANEOUS | 0 refills | Status: DC

## 2016-06-10 MED ORDER — DOCUSATE SODIUM 100 MG PO CAPS: 100.0000 mg | ORAL_CAPSULE | Freq: Two times a day (BID) | ORAL | 2 refills | Status: DC | PRN

## 2016-06-10 NOTE — Progress Notes (Signed)
C/o itching of hands and legs, worse at night- hard to sleep. C/o some cramping, but no bleeding. Reports high cholesterol.

## 2016-06-10 NOTE — Patient Instructions (Signed)

## 2016-06-10 NOTE — Progress Notes (Signed)
Subjective:  Ashley Rhodes is a 30 y.o. G5P0040 at 328w1d being seen today for ongoing prenatal care.  She is currently monitored for the following issues for this high-risk pregnancy and has Uterine synechiae; Hypothyroidism; and Supervision of high-risk pregnancy on her problem list.  Patient reports occasional contractions and itching of hands and feet.  Contractions: Not present. Vag. Bleeding: None.  Movement: Present. Denies leaking of fluid.   The following portions of the patient's history were reviewed and updated as appropriate: allergies, current medications, past family history, past medical history, past social history, past surgical history and problem list. Problem list updated.  Objective:   Vitals:   06/10/16 0812  BP: 110/68  Pulse: 91  Weight: 152 lb 4.8 oz (69.1 kg)    Fetal Status: Fetal Heart Rate (bpm): 129   Movement: Present     General:  Alert, oriented and cooperative. Patient is in no acute distress.  Skin: Skin is warm and dry. No rash noted.   Cardiovascular: Normal heart rate noted  Respiratory: Normal respiratory effort, no problems with respiration noted  Abdomen: Soft, gravid, appropriate for gestational age. Pain/Pressure: Present     Pelvic:  Cervical exam deferred        Extremities: Normal range of motion.  Edema: Mild pitting, slight indentation  Mental Status: Normal mood and affect. Normal behavior. Normal judgment and thought content.   Urinalysis:      Assessment and Plan:  Pregnancy: G5P0040 at 320w1d  1. Pruritus of pregnancy, third trimester  - Bile acids, total - hydrocortisone cream 0.5 %; Apply 1 application topically 2 (two) times daily.  Dispense: 30 g; Refill: 0  2. Supervision of high-risk pregnancy, third trimester   3. Hypothyroidism during pregnancy, third trimester --On Synthroid  4. Constipation during pregnancy in third trimester --Discussed dietary changes including high fiber diet increased PO fluids -  docusate sodium (COLACE) 100 MG capsule; Take 1 capsule (100 mg total) by mouth 2 (two) times daily as needed.  Dispense: 30 capsule; Refill: 2  Preterm labor symptoms and general obstetric precautions including but not limited to vaginal bleeding, contractions, leaking of fluid and fetal movement were reviewed in detail with the patient. Please refer to After Visit Summary for other counseling recommendations.  Return in 2 weeks (on 06/24/2016).   Hurshel PartyLisa A Leftwich-Kirby, CNM

## 2016-06-13 LAB — BILE ACIDS, TOTAL: Bile Acids Total: 6 umol/L (ref 0–19)

## 2016-06-26 ENCOUNTER — Ambulatory Visit (INDEPENDENT_AMBULATORY_CARE_PROVIDER_SITE_OTHER): Payer: Medicaid Other | Admitting: Family Medicine

## 2016-06-26 VITALS — BP 115/73 | HR 91 | Temp 98.3°F | Wt 155.0 lb

## 2016-06-26 DIAGNOSIS — O99283 Endocrine, nutritional and metabolic diseases complicating pregnancy, third trimester: Secondary | ICD-10-CM

## 2016-06-26 DIAGNOSIS — O0993 Supervision of high risk pregnancy, unspecified, third trimester: Secondary | ICD-10-CM

## 2016-06-26 DIAGNOSIS — E039 Hypothyroidism, unspecified: Secondary | ICD-10-CM

## 2016-06-26 LAB — POCT URINALYSIS DIP (DEVICE)
Bilirubin Urine: NEGATIVE
Glucose, UA: NEGATIVE mg/dL
Hgb urine dipstick: NEGATIVE
Ketones, ur: NEGATIVE mg/dL
Leukocytes, UA: NEGATIVE
NITRITE: NEGATIVE
PH: 7 (ref 5.0–8.0)
PROTEIN: NEGATIVE mg/dL
Specific Gravity, Urine: 1.02 (ref 1.005–1.030)
Urobilinogen, UA: 0.2 mg/dL (ref 0.0–1.0)

## 2016-06-26 MED ORDER — LEVOTHYROXINE SODIUM 50 MCG PO TABS: 500.0000 ug | ORAL_TABLET | Freq: Every day | ORAL | 1 refills | Status: DC

## 2016-06-26 MED ORDER — LEVOTHYROXINE SODIUM 50 MCG PO TABS: 500.0000 ug | ORAL_TABLET | Freq: Every day | ORAL | 2 refills | Status: DC

## 2016-06-26 NOTE — Progress Notes (Signed)
   PRENATAL VISIT NOTE  Subjective:  Rosabelle Madaline Savageokharel Khanal is a 30 y.o. G5P0040 at 7375w3d being seen today for ongoing prenatal care.  She is currently monitored for the following issues for this low-risk pregnancy and has Uterine synechiae; Hypothyroidism; and Supervision of high-risk pregnancy on her problem list.  Patient reports abdominal itching..  Contractions: Irregular. Vag. Bleeding: None.  Movement: Present. Denies leaking of fluid.   The following portions of the patient's history were reviewed and updated as appropriate: allergies, current medications, past family history, past medical history, past social history, past surgical history and problem list. Problem list updated.  Objective:   Vitals:   06/26/16 0835  BP: 115/73  Pulse: 91  Temp: 98.3 F (36.8 C)  Weight: 155 lb (70.3 kg)    Fetal Status: Fetal Heart Rate (bpm): 151   Movement: Present     General:  Alert, oriented and cooperative. Patient is in no acute distress.  Skin: Skin is warm and dry. No rash noted.   Cardiovascular: Normal heart rate noted  Respiratory: Normal respiratory effort, no problems with respiration noted  Abdomen: Soft, gravid, appropriate for gestational age.  No rash.  Stretch marks.  Pain/Pressure: Absent     Pelvic:  Cervical exam deferred        Extremities: Normal range of motion.  Edema: Mild pitting, slight indentation  Mental Status: Normal mood and affect. Normal behavior. Normal judgment and thought content.   Urinalysis:      Assessment and Plan:  Pregnancy: G5P0040 at 7175w3d  1. Supervision of high-risk pregnancy, third trimester FHT and FH normal.  Eucerin cream for stretch marks. - POCT urinalysis dip (device)  2. Hypothyroidism affecting pregnancy, antepartum, third trimester Refill thyroid medication.   Preterm labor symptoms and general obstetric precautions including but not limited to vaginal bleeding, contractions, leaking of fluid and fetal movement were  reviewed in detail with the patient. Please refer to After Visit Summary for other counseling recommendations.  No Follow-up on file.  Levie HeritageJacob J Foye Damron, DO

## 2016-07-13 ENCOUNTER — Ambulatory Visit (INDEPENDENT_AMBULATORY_CARE_PROVIDER_SITE_OTHER): Payer: Medicaid Other | Admitting: Family Medicine

## 2016-07-13 VITALS — BP 126/76 | HR 98 | Wt 157.5 lb

## 2016-07-13 DIAGNOSIS — Z23 Encounter for immunization: Secondary | ICD-10-CM | POA: Diagnosis not present

## 2016-07-13 DIAGNOSIS — O99283 Endocrine, nutritional and metabolic diseases complicating pregnancy, third trimester: Secondary | ICD-10-CM

## 2016-07-13 DIAGNOSIS — E039 Hypothyroidism, unspecified: Secondary | ICD-10-CM | POA: Diagnosis not present

## 2016-07-13 DIAGNOSIS — O0993 Supervision of high risk pregnancy, unspecified, third trimester: Secondary | ICD-10-CM

## 2016-07-13 DIAGNOSIS — N856 Intrauterine synechiae: Secondary | ICD-10-CM

## 2016-07-13 LAB — POCT URINALYSIS DIP (DEVICE)
BILIRUBIN URINE: NEGATIVE
GLUCOSE, UA: NEGATIVE mg/dL
Hgb urine dipstick: NEGATIVE
Ketones, ur: NEGATIVE mg/dL
LEUKOCYTES UA: NEGATIVE
NITRITE: NEGATIVE
Protein, ur: NEGATIVE mg/dL
Specific Gravity, Urine: 1.02 (ref 1.005–1.030)
Urobilinogen, UA: 0.2 mg/dL (ref 0.0–1.0)
pH: 7 (ref 5.0–8.0)

## 2016-07-13 MED ORDER — TETANUS-DIPHTH-ACELL PERTUSSIS 5-2.5-18.5 LF-MCG/0.5 IM SUSP
0.5000 mL | Freq: Once | INTRAMUSCULAR | Status: AC
  Administered 2016-07-13: 0.5 mL via INTRAMUSCULAR

## 2016-07-13 NOTE — Progress Notes (Signed)
Subjective:  Ashley Rhodes is a 30 y.o. G5P0040 at 2568w6d being seen today for ongoing prenatal care.  She is currently monitored for the following issues for this low-risk pregnancy and has Uterine synechiae; Hypothyroidism affecting pregnancy, antepartum; and Supervision of high-risk pregnancy on her problem list.  Patient reports no complaints.  Contractions: Irritability. Vag. Bleeding: None.  Movement: Present. Denies leaking of fluid.   The following portions of the patient's history were reviewed and updated as appropriate: allergies, current medications, past family history, past medical history, past social history, past surgical history and problem list. Problem list updated.  Objective:   Vitals:   07/13/16 1538  BP: 126/76  Pulse: 98  Weight: 157 lb 8 oz (71.4 kg)    Fetal Status: Fetal Heart Rate (bpm): 130 Fundal Height: 33 cm Movement: Present  Presentation: Transverse  General:  Alert, oriented and cooperative. Patient is in no acute distress.  Skin: Skin is warm and dry. No rash noted.   Cardiovascular: Normal heart rate noted  Respiratory: Normal respiratory effort, no problems with respiration noted  Abdomen: Soft, gravid, appropriate for gestational age. Pain/Pressure: Present    Leopold's: TRANSVERSE LIE  Pelvic:  Cervical exam deferred        Extremities: Normal range of motion.  Edema: Trace  Mental Status: Normal mood and affect. Normal behavior. Normal judgment and thought content.   Urinalysis: Urine Protein: Negative Urine Glucose: Negative  Assessment and Plan:  Pregnancy: G5P0040 at 5468w6d  1. Supervision of high-risk pregnancy, third trimester - Glucose Tolerance, 1 HR (50g) - TSH - HIV antibody (with reflex) - CBC - RPR - Reassess fetal position at next visit, consider cephalic external version if not cephalic  2. Uterine synechiae - Not seen on 05/26/16 US  3. Hypothyroidism affecting pregnancy, antepartum, third trimester -  TSH  Preterm labor symptoms and general obstetric precautions including but not limited to vaginal bleeding, contractions, leaking of fluid and fetal movement were reviewed in detail with the patient. Please refer to After Visit Summary for other counseling recommendations.  Return in about 1 week (around 07/20/2016) for Routine OB visit.   [facility] HospitalElizabeth Woodland [place], [place]

## 2016-07-13 NOTE — Progress Notes (Signed)
Declines flu 28 week labs/gtt today

## 2016-07-13 NOTE — Patient Instructions (Signed)

## 2016-07-14 LAB — CBC
HEMATOCRIT: 34.4 % — AB (ref 35.0–45.0)
Hemoglobin: 11.5 g/dL — ABNORMAL LOW (ref 11.7–15.5)
MCH: 30 pg (ref 27.0–33.0)
MCHC: 33.4 g/dL (ref 32.0–36.0)
MCV: 89.8 fL (ref 80.0–100.0)
MPV: 11 fL (ref 7.5–12.5)
PLATELETS: 136 10*3/uL — AB (ref 140–400)
RBC: 3.83 MIL/uL (ref 3.80–5.10)
RDW: 14.4 % (ref 11.0–15.0)
WBC: 7.7 10*3/uL (ref 3.8–10.8)

## 2016-07-15 LAB — RPR

## 2016-07-15 LAB — TSH: TSH: 2.41 m[IU]/L

## 2016-07-15 LAB — GLUCOSE TOLERANCE, 1 HOUR (50G) W/O FASTING: GLUCOSE, 1 HR, GESTATIONAL: 176 mg/dL — AB (ref <140)

## 2016-07-15 LAB — HIV ANTIBODY (ROUTINE TESTING W REFLEX): HIV 1&2 Ab, 4th Generation: NONREACTIVE

## 2016-07-21 ENCOUNTER — Encounter: Payer: Self-pay | Admitting: Obstetrics and Gynecology

## 2016-07-21 ENCOUNTER — Ambulatory Visit (INDEPENDENT_AMBULATORY_CARE_PROVIDER_SITE_OTHER): Payer: Medicaid Other | Admitting: Obstetrics and Gynecology

## 2016-07-21 VITALS — BP 114/74 | HR 86 | Wt 161.0 lb

## 2016-07-21 DIAGNOSIS — D696 Thrombocytopenia, unspecified: Secondary | ICD-10-CM

## 2016-07-21 DIAGNOSIS — O24419 Gestational diabetes mellitus in pregnancy, unspecified control: Secondary | ICD-10-CM | POA: Insufficient documentation

## 2016-07-21 DIAGNOSIS — O99283 Endocrine, nutritional and metabolic diseases complicating pregnancy, third trimester: Secondary | ICD-10-CM

## 2016-07-21 DIAGNOSIS — O99113 Other diseases of the blood and blood-forming organs and certain disorders involving the immune mechanism complicating pregnancy, third trimester: Secondary | ICD-10-CM

## 2016-07-21 DIAGNOSIS — O99119 Other diseases of the blood and blood-forming organs and certain disorders involving the immune mechanism complicating pregnancy, unspecified trimester: Secondary | ICD-10-CM

## 2016-07-21 DIAGNOSIS — N856 Intrauterine synechiae: Secondary | ICD-10-CM

## 2016-07-21 DIAGNOSIS — E039 Hypothyroidism, unspecified: Secondary | ICD-10-CM

## 2016-07-21 DIAGNOSIS — O99810 Abnormal glucose complicating pregnancy: Secondary | ICD-10-CM

## 2016-07-21 NOTE — Progress Notes (Signed)
Prenatal Visit Note Date: 07/21/2016 [facility]  Subjective:  Ashley Rhodes is a 30 y.o. G5P0040 at 6762w0d being seen today for ongoing prenatal care.  She is currently monitored for the following issues for this low-risk pregnancy and has Uterine synechiae; Hypothyroidism affecting pregnancy, antepartum; Supervision of high-risk pregnancy; Gestational thrombocytopenia (HCC); and Abnormal glucose affecting pregnancy on her problem list.  Patient reports no complaints.   Contractions: Irritability.  .  Movement: Present. Denies leaking of fluid.   The following portions of the patient's history were reviewed and updated as appropriate: allergies, current medications, past family history, past medical history, past social history, past surgical history and problem list. Problem list updated.  Objective:   Vitals:   07/21/16 1531  BP: 114/74  Pulse: 86  Weight: 161 lb (73 kg)    Fetal Status: Fetal Heart Rate (bpm): 146 Fundal Height: 35 cm Movement: Present  Presentation: Vertex  General:  Alert, oriented and cooperative. Patient is in no acute distress.  Skin: Skin is warm and dry. No rash noted.   Cardiovascular: Normal heart rate noted  Respiratory: Normal respiratory effort, no problems with respiration noted  Abdomen: Soft, gravid, appropriate for gestational age. Pain/Pressure: Absent     Pelvic:  Cervical exam deferred        Extremities: Normal range of motion.     Mental Status: Normal mood and affect. Normal behavior. Normal judgment and thought content.   Urinalysis:      Assessment and Plan:  Pregnancy: G5P0040 at 8562w0d  1. Abnormal glucose affecting pregnancy D/w pt that she had accidental 1hr last visit. Looking back, it looks like she had a 24wk NOB glucola which was 134. I told her that unfortunately we should tx her failed 1hr at 34wks as real even though, since she's past 24wks, her risk of having a false+ is high. D/w her  to do either qid 1wk BS checks or a 3hr and she'd like to do that.   2. Gestational thrombocytopenia, third trimester (HCC) Rpt at 36-37wks  3. Uterine synechiae No e/o issues at last mfm growth scan. No AP surveillance advised.  4. Hypothyroidism affecting pregnancy, antepartum, third trimester Normal tsh last visit. Continue on 50mcg synthroid qday.  Preterm labor symptoms and general obstetric precautions including but not limited to vaginal bleeding, contractions, leaking of fluid and fetal movement were reviewed in detail with the patient. Please refer to After Visit Summary for other counseling recommendations.  Return in about 1 week (around 07/28/2016).   [place] Bingharlie Ahtziry Saathoff, MD

## 2016-07-23 ENCOUNTER — Other Ambulatory Visit: Payer: Medicaid Other

## 2016-07-23 DIAGNOSIS — R7309 Other abnormal glucose: Secondary | ICD-10-CM

## 2016-07-24 LAB — GLUCOSE TOLERANCE, 3 HOURS
GLUCOSE 3 HOUR GTT: 177 mg/dL — AB (ref <145)
Glucose Tolerance, 1 hour: 188 mg/dL (ref <190)
Glucose Tolerance, 2 hour: 190 mg/dL — ABNORMAL HIGH (ref <165)
Glucose Tolerance, Fasting: 82 mg/dL (ref 65–104)

## 2016-07-27 ENCOUNTER — Encounter: Payer: Self-pay | Admitting: Obstetrics and Gynecology

## 2016-07-28 ENCOUNTER — Other Ambulatory Visit (HOSPITAL_COMMUNITY)
Admission: RE | Admit: 2016-07-28 | Discharge: 2016-07-28 | Disposition: A | Discharge status: Home / Self Care | Payer: Medicaid Other | Source: Ambulatory Visit | Attending: Obstetrics and Gynecology | Admitting: Obstetrics and Gynecology

## 2016-07-28 ENCOUNTER — Ambulatory Visit (INDEPENDENT_AMBULATORY_CARE_PROVIDER_SITE_OTHER): Payer: Medicaid Other | Admitting: Obstetrics and Gynecology

## 2016-07-28 VITALS — BP 117/86 | HR 81 | Wt 160.0 lb

## 2016-07-28 DIAGNOSIS — Z113 Encounter for screening for infections with a predominantly sexual mode of transmission: Secondary | ICD-10-CM | POA: Insufficient documentation

## 2016-07-28 DIAGNOSIS — O24419 Gestational diabetes mellitus in pregnancy, unspecified control: Secondary | ICD-10-CM

## 2016-07-28 DIAGNOSIS — E039 Hypothyroidism, unspecified: Secondary | ICD-10-CM | POA: Diagnosis not present

## 2016-07-28 DIAGNOSIS — O99283 Endocrine, nutritional and metabolic diseases complicating pregnancy, third trimester: Secondary | ICD-10-CM | POA: Diagnosis present

## 2016-07-28 DIAGNOSIS — O0993 Supervision of high risk pregnancy, unspecified, third trimester: Secondary | ICD-10-CM

## 2016-07-28 DIAGNOSIS — O99280 Endocrine, nutritional and metabolic diseases complicating pregnancy, unspecified trimester: Principal | ICD-10-CM

## 2016-07-28 LAB — OB RESULTS CONSOLE GC/CHLAMYDIA: GC PROBE AMP, GENITAL: NEGATIVE

## 2016-07-28 LAB — OB RESULTS CONSOLE GBS: STREP GROUP B AG: NEGATIVE

## 2016-07-28 LAB — POCT URINALYSIS DIP (DEVICE)
Bilirubin Urine: NEGATIVE
Glucose, UA: NEGATIVE mg/dL
HGB URINE DIPSTICK: NEGATIVE
KETONES UR: NEGATIVE mg/dL
Leukocytes, UA: NEGATIVE
Nitrite: NEGATIVE
PH: 7 (ref 5.0–8.0)
PROTEIN: NEGATIVE mg/dL
SPECIFIC GRAVITY, URINE: 1.015 (ref 1.005–1.030)
UROBILINOGEN UA: 0.2 mg/dL (ref 0.0–1.0)

## 2016-07-28 NOTE — Progress Notes (Signed)
   PRENATAL VISIT NOTE  Subjective:  Ashley Rhodes is a 30 y.o. G5P0040 at 6119w0d being seen today for ongoing prenatal care.  She is currently monitored for the following issues for this high-risk pregnancy and has Uterine synechiae; Hypothyroidism affecting pregnancy, antepartum; Supervision of high-risk pregnancy; Gestational thrombocytopenia (HCC); and GDM (gestational diabetes mellitus) on her problem list.  Patient reports no complaints. Unaware of abnl 1 hr screen and abnl 3hr OGTT result.  Contractions: Irregular. Vag. Bleeding: None.  Movement: Present. Denies leaking of fluid.   The following portions of the patient's history were reviewed and updated as appropriate: allergies, current medications, past family history, past medical history, past social history, past surgical history and problem list. Problem list updated.  Objective:   Vitals:   07/28/16 0755  BP: 117/86  Pulse: 81  Weight: 160 lb (72.6 kg)    Fetal Status: Fetal Heart Rate (bpm): 145   Movement: Present     General:  Alert, oriented and cooperative. Patient is in no acute distress.  Skin: Skin is warm and dry. No rash noted.   Cardiovascular: Normal heart rate noted  Respiratory: Normal respiratory effort, no problems with respiration noted  Abdomen: Soft, gravid, appropriate for gestational age. Pain/Pressure: Present     Pelvic:  Cervical exam performed        Extremities: Normal range of motion.  Edema: None  Mental Status: Normal mood and affect. Normal behavior. Normal judgment and thought content.   Urinalysis:      Assessment and Plan:  Pregnancy: G5P0040 at 7719w0d  1. Hypothyroidism affecting pregnancy, antepartum On Synthroid as directed - GC/Chlamydia probe amp ([place] - Culture, beta strep (group b only) - US MFM OB FOLLOW UP; Future  2. Supervision of high risk pregnancy in third trimester  - GC[facility]Chlamydia probe amp [facility] - Culture, beta  strep (group b only) - US MFM OB FOLLOW UP; Future  3. Gestational diabetes mellitus (GDM), antepartum, gestational diabetes method of control unspecified Late Dx. Pt unaware: Discussed diet, stop all simple sugars; set up Diabetes Educator asap, qid CBGs; S=D so US in 2 wks per protocol A1 GDM - US MFM OB FOLLOW UP; Future  Preterm labor symptoms and general obstetric precautions including but not limited to vaginal bleeding, contractions, leaking of fluid and fetal movement were reviewed in detail with the patient. Please refer to After Visit Summary for other counseling recommendations.  Return in about 6 days (around 08/03/2016) for DM educator and OB F/U.  Danae Orleanseirdre C Siedah Sedor, CNM

## 2016-07-28 NOTE — Patient Instructions (Signed)

## 2016-07-28 NOTE — Progress Notes (Signed)
Made appointment for Diabetic education for first available for Monday 10/9. Gave patient GDM education packet with brief diet instruction to avoid sugars, count carbs and explained she will see educator Monday.

## 2016-07-29 LAB — GC/CHLAMYDIA PROBE AMP (~~LOC~~) NOT AT ARMC
Chlamydia: NEGATIVE
Neisseria Gonorrhea: NEGATIVE

## 2016-07-31 LAB — CULTURE, BETA STREP (GROUP B ONLY)

## 2016-08-10 ENCOUNTER — Other Ambulatory Visit: Payer: Medicaid Other

## 2016-08-10 ENCOUNTER — Ambulatory Visit (INDEPENDENT_AMBULATORY_CARE_PROVIDER_SITE_OTHER): Payer: Medicaid Other | Admitting: Obstetrics and Gynecology

## 2016-08-10 ENCOUNTER — Encounter: Payer: Medicaid Other | Attending: Obstetrics and Gynecology | Admitting: Dietician

## 2016-08-10 VITALS — BP 133/80 | HR 94 | Wt 159.6 lb

## 2016-08-10 DIAGNOSIS — O99283 Endocrine, nutritional and metabolic diseases complicating pregnancy, third trimester: Secondary | ICD-10-CM

## 2016-08-10 DIAGNOSIS — D696 Thrombocytopenia, unspecified: Secondary | ICD-10-CM

## 2016-08-10 DIAGNOSIS — O99113 Other diseases of the blood and blood-forming organs and certain disorders involving the immune mechanism complicating pregnancy, third trimester: Secondary | ICD-10-CM

## 2016-08-10 DIAGNOSIS — O24419 Gestational diabetes mellitus in pregnancy, unspecified control: Secondary | ICD-10-CM | POA: Diagnosis present

## 2016-08-10 DIAGNOSIS — Z3A37 37 weeks gestation of pregnancy: Secondary | ICD-10-CM | POA: Diagnosis not present

## 2016-08-10 DIAGNOSIS — O99280 Endocrine, nutritional and metabolic diseases complicating pregnancy, unspecified trimester: Secondary | ICD-10-CM

## 2016-08-10 DIAGNOSIS — E039 Hypothyroidism, unspecified: Secondary | ICD-10-CM

## 2016-08-10 DIAGNOSIS — O0993 Supervision of high risk pregnancy, unspecified, third trimester: Secondary | ICD-10-CM

## 2016-08-10 DIAGNOSIS — Z713 Dietary counseling and surveillance: Secondary | ICD-10-CM | POA: Diagnosis not present

## 2016-08-10 LAB — POCT URINALYSIS DIP (DEVICE)
BILIRUBIN URINE: NEGATIVE
Glucose, UA: NEGATIVE mg/dL
HGB URINE DIPSTICK: NEGATIVE
Ketones, ur: NEGATIVE mg/dL
Leukocytes, UA: NEGATIVE
NITRITE: NEGATIVE
PH: 7 (ref 5.0–8.0)
PROTEIN: NEGATIVE mg/dL
Specific Gravity, Urine: 1.02 (ref 1.005–1.030)
UROBILINOGEN UA: 0.2 mg/dL (ref 0.0–1.0)

## 2016-08-10 MED ORDER — GLUCOSE BLOOD VI STRP: ORAL_STRIP | 12 refills | Status: DC

## 2016-08-10 MED ORDER — ACCU-CHEK AVIVA PLUS W/DEVICE KIT: 1.0000 | PACK | Freq: Four times a day (QID) | 0 refills | Status: DC

## 2016-08-10 MED ORDER — ACCU-CHEK FASTCLIX LANCETS MISC: 1.0000 | Freq: Four times a day (QID) | 12 refills | Status: DC

## 2016-08-10 NOTE — Progress Notes (Signed)
Diabetes Education: 08/10/16 [name] is a 30 yr old lady, who is a G5P0, with an EDD of 08/25/16.  Notes that both of her parents have diabetes.   Review of GDM and the measures used to assist with blood glucose control.   Review of the post partum measures to prevent of delay the onset of type 2 diabetes in the future.  Recommended regular daily walking for 30 minutes.  For tight glucose control, consider walking 15 minutes following each meal.  Can walk in place at home if weather is bad. Review of blood glucose monitoring procedure and blood glucose goals for fasting and 2 hr post prandial blood glucose.  During demonstration of the [employer], Accu-Check Aviva Plus meter, her random glucose that was approximately 2 hours following breakfast was 89 mg/dl.  Instructed to record glucose readings and to bring meter and glucose log to all clinic appointments. Completed review of carb foods, portions, carb counting, label reading, meal planning, meal and snack scheduling.   Provided handout "Nutrition, Diabetes, and Pregnancy" and Yellow Carb Counting Card. Will follow as needed. [name], RN, RD, LDN

## 2016-08-10 NOTE — Addendum Note (Signed)
Addended by: Darrel HooverASSETTE, KELLY P on: 08/10/2016 01:11 PM   Modules accepted: Orders

## 2016-08-10 NOTE — Addendum Note (Signed)
Addended by: Faythe CasaBELLAMY, Jackelyne Sayer M on: 08/10/2016 05:33 PM   Modules accepted: Orders

## 2016-08-10 NOTE — Patient Instructions (Signed)
Gestational Diabetes Mellitus  Gestational diabetes mellitus, often simply referred to as gestational diabetes, is a type of diabetes that some women develop during pregnancy. In gestational diabetes, the pancreas does not make enough insulin (a hormone), the cells are less responsive to the insulin that is made (insulin resistance), or both. Normally, insulin moves sugars from food into the tissue cells. The tissue cells use the sugars for energy. The lack of insulin or the lack of normal response to insulin causes excess sugars to build up in the blood instead of going into the tissue cells. As a result, high blood sugar (hyperglycemia) develops. The effect of high sugar (glucose) levels can cause many problems.   RISK FACTORS  You have an increased chance of developing gestational diabetes if you have a family history of diabetes and also have one or more of the following risk factors:  · A body mass index over 30 (obesity).  · A previous pregnancy with gestational diabetes.  · An older age at the time of pregnancy.  If blood glucose levels are kept in the normal range during pregnancy, women can have a healthy pregnancy. If your blood glucose levels are not well controlled, there may be risks to you, your unborn baby (fetus), your labor and delivery, or your newborn baby.   SYMPTOMS   If symptoms are experienced, they are much like symptoms you would normally expect during pregnancy. The symptoms of gestational diabetes include:   · Increased thirst (polydipsia).  · Increased urination (polyuria).  · Increased urination during the night (nocturia).  · Weight loss. This weight loss may be rapid.  · Frequent, recurring infections.  · Tiredness (fatigue).  · Weakness.  · Vision changes, such as blurred vision.  · Fruity smell to your breath.  · Abdominal pain.  DIAGNOSIS  Diabetes is diagnosed when blood glucose levels are increased. Your blood glucose level may be checked by one or more of the following blood  tests:  · A fasting blood glucose test. You will not be allowed to eat for at least 8 hours before a blood sample is taken.  · A random blood glucose test. Your blood glucose is checked at any time of the day regardless of when you ate.  · An oral glucose tolerance test (OGTT). Your blood glucose is measured after you have not eaten (fasted) for 1-3 hours and then after you drink a glucose-containing beverage. Since the hormones that cause insulin resistance are highest at about 24-28 weeks of a pregnancy, an OGTT is usually performed during that time. If you have risk factors, you may be screened for undiagnosed type 2 diabetes at your first prenatal visit.  TREATMENT   Gestational diabetes should be managed first with diet and exercise. Medicines may be added only if they are needed.  · You will need to take diabetes medicine or insulin daily to keep blood glucose levels in the desired range.  · You will need to match insulin dosing with exercise and healthy food choices.  If you have gestational diabetes, your treatment goal is to maintain the following blood glucose levels:  · Before meals (preprandial): at or below 95 mg/dL.  · After meals (postprandial):    One hour after a meal: at or below 140 mg/dL.    Two hours after a meal: at or below 120 mg/dL.  If you have pre-existing type 1 or type 2 diabetes, your treatment goal is to maintain the following blood glucose levels:  · Before   meals, at bedtime, and overnight: 60-99 mg/dL.  · After meals: peak of 100-129 mg/dL.  HOME CARE INSTRUCTIONS   · Have your hemoglobin A1c level checked twice a year.  · Perform daily blood glucose monitoring as directed by your health care provider. It is common to perform frequent blood glucose monitoring.  · Monitor urine ketones when you are ill and as directed by your health care provider.  · Take your diabetes medicine and insulin as directed by your health care provider to maintain your blood glucose level in the desired  range.  ¨ Never run out of diabetes medicine or insulin. It is needed every day.  ¨ Adjust insulin based on your intake of carbohydrates. Carbohydrates can raise blood glucose levels but need to be included in your diet. Carbohydrates provide vitamins, minerals, and fiber which are an essential part of a healthy diet. Carbohydrates are found in fruits, vegetables, whole grains, dairy products, legumes, and foods containing added sugars.  · Eat healthy foods. Alternate 3 meals with 3 snacks.  · Maintain a healthy weight gain. The usual total expected weight gain varies according to your prepregnancy body mass index (BMI).  · Carry a medical alert card or wear your medical alert jewelry.  · Carry a 15-gram carbohydrate snack with you at all times to treat low blood glucose (hypoglycemia). Some examples of 15-gram carbohydrate snacks include:  ¨ Glucose tablets, 3 or 4.  ¨ Glucose gel, 15-gram tube.  ¨ Raisins, 2 tablespoons (24 g).  ¨ Jelly beans, 6.  ¨ Animal crackers, 8.  ¨ Fruit juice, regular soda, or low-fat milk, 4 ounces (120 mL).  ¨ Gummy treats, 9.  · Recognize hypoglycemia. Hypoglycemia during pregnancy occurs with blood glucose levels of 60 mg/dL and below. The risk for hypoglycemia increases when fasting or skipping meals, during or after intense exercise, and during sleep. Hypoglycemia symptoms can include:  ¨ Tremors or shakes.  ¨ Decreased ability to concentrate.  ¨ Sweating.  ¨ Increased heart rate.  ¨ Headache.  ¨ Dry mouth.  ¨ Hunger.  ¨ Irritability.  ¨ Anxiety.  ¨ Restless sleep.  ¨ Altered speech or coordination.  ¨ Confusion.  · Treat hypoglycemia promptly. If you are alert and able to safely swallow, follow the 15:15 rule:  ¨ Take 15-20 grams of rapid-acting glucose or carbohydrate. Rapid-acting options include glucose gel, glucose tablets, or 4 ounces (120 mL) of fruit juice, regular soda, or low-fat milk.  ¨ Check your blood glucose level 15 minutes after taking the glucose.  ¨ Take 15-20  grams more of glucose if the repeat blood glucose level is still 70 mg/dL or below.  ¨ Eat a meal or snack within 1 hour once blood glucose levels return to normal.  · Be alert to polyuria (excess urination) and polydipsia (excess thirst) which are early signs of hyperglycemia. An early awareness of hyperglycemia allows for prompt treatment. Treat hyperglycemia as directed by your health care provider.  · Engage in at least 30 minutes of physical activity a day or as directed by your health care provider. Ten minutes of physical activity timed 30 minutes after each meal is encouraged to control postprandial blood glucose levels.  · Adjust your insulin dosing and food intake as needed if you start a new exercise or sport.  · Follow your sick-day plan at any time you are unable to eat or drink as usual.  · Avoid tobacco and alcohol use.  · Keep all follow-up visits as directed   by your health care provider.  · Follow the advice of your health care provider regarding your prenatal and post-delivery (postpartum) appointments, meal planning, exercise, medicines, vitamins, blood tests, other medical tests, and physical activities.  · Perform daily skin and foot care. Examine your skin and feet daily for cuts, bruises, redness, nail problems, bleeding, blisters, or sores.  · Brush your teeth and gums at least twice a day and floss at least once a day. Follow up with your dentist regularly.  · Schedule an eye exam during the first trimester of your pregnancy or as directed by your health care provider.  · Share your diabetes management plan with your workplace or school.  · Stay up-to-date with immunizations.  · Learn to manage stress.  · Obtain ongoing diabetes education and support as needed.  · Learn about and consider breastfeeding your baby.  · You should have your blood sugar level checked 6-12 weeks after delivery. This is done with an oral glucose tolerance test (OGTT).  SEEK MEDICAL CARE IF:   · You are unable to  eat food or drink fluids for more than 6 hours.  · You have nausea and vomiting for more than 6 hours.  · You have a blood glucose level of 200 mg/dL and you have ketones in your urine.  · There is a change in mental status.  · You develop vision problems.  · You have a persistent headache.  · You have upper abdominal pain or discomfort.  · You develop an additional serious illness.  · You have diarrhea for more than 6 hours.  · You have been sick or have had a fever for a couple of days and are not getting better.  SEEK IMMEDIATE MEDICAL CARE IF:   · You have difficulty breathing.  · You no longer feel the baby moving.  · You are bleeding or have discharge from your vagina.  · You start having premature contractions or labor.  MAKE SURE YOU:  · Understand these instructions.  · Will watch your condition.  · Will get help right away if you are not doing well or get worse.     This information is not intended to replace advice given to you by your health care provider. Make sure you discuss any questions you have with your health care provider.     Document Released: 01/18/2001 Document Revised: 11/02/2014 Document Reviewed: 05/10/2012  Elsevier Interactive Patient Education ©2016 [employer]

## 2016-08-10 NOTE — Progress Notes (Signed)
   PRENATAL VISIT NOTE  Subjective:  Ashley Rhodes is a 30 y.o. G5P0040 at 327w6d being seen today for ongoing prenatal care.  She is currently monitored for the following issues for this high-risk pregnancy and has Uterine synechiae; Hypothyroidism affecting pregnancy, antepartum; Supervision of high-risk pregnancy; Gestational thrombocytopenia (HCC); and GDM (gestational diabetes mellitus) on her problem list. Newly diagnosed GDM with 3 abnormals on 3 hr OGTT done 07/23/16.  Patient reports no complaints.  Contractions: Irregular. Vag. Bleeding: None.  Movement: Present. Denies leaking of fluid.   The following portions of the patient's history were reviewed and updated as appropriate: allergies, current medications, past family history, past medical history, past social history, past surgical history and problem list. Problem list updated.  Objective:   Vitals:   08/10/16 0756  BP: 133/80  Pulse: 94  Weight: 159 lb 9.6 oz (72.4 kg)    Fetal Status: Fetal Heart Rate (bpm): 139   Movement: Present     General:  Alert, oriented and cooperative. Patient is in no acute distress.  Skin: Skin is warm and dry. No rash noted.   Cardiovascular: Normal heart rate noted  Respiratory: Normal respiratory effort, no problems with respiration noted  Abdomen: Soft, gravid, appropriate for gestational age. Pain/Pressure: Present     Pelvic:  Cervical exam deferred        Extremities: Normal range of motion.  Edema: None  Mental Status: Normal mood and affect. Normal behavior. Normal judgment and thought content.   Urinalysis:    pending 9/11/03/15: TSH 2.41 and Plt 136k Assessment and Plan:  Pregnancy: G5P0040 at 6827w6d  1. Gestational diabetes mellitus (GDM), antepartum, gestational diabetes method of control unspecified Late dx> US tomorrow and will add BPP, NST today, initial counseling with Maggie today and start qid CBGs 2. Benign gestational thrombocytopenia in third trimester  (HCC) Stable   3. Supervision of high risk pregnancy in third trimester    4. Hypothyroidism affecting pregnancy, antepartum Controlled on synthroid  Term labor symptoms and general obstetric precautions including but not limited to vaginal bleeding, contractions, leaking of fluid and fetal movement were reviewed in detail with the patient. Please refer to After Visit Summary for other counseling recommendations.  Return in about 1 week (around 08/17/2016).  Danae Orleanseirdre C Poe, CNM

## 2016-08-11 ENCOUNTER — Other Ambulatory Visit: Payer: Self-pay | Admitting: Obstetrics and Gynecology

## 2016-08-11 ENCOUNTER — Encounter (HOSPITAL_COMMUNITY): Payer: Self-pay

## 2016-08-11 ENCOUNTER — Ambulatory Visit (HOSPITAL_COMMUNITY)
Admission: RE | Admit: 2016-08-11 | Discharge: 2016-08-11 | Disposition: A | Discharge status: Home / Self Care | Payer: Medicaid Other | Source: Ambulatory Visit | Attending: Obstetrics and Gynecology | Admitting: Obstetrics and Gynecology

## 2016-08-11 DIAGNOSIS — E039 Hypothyroidism, unspecified: Secondary | ICD-10-CM

## 2016-08-11 DIAGNOSIS — O99280 Endocrine, nutritional and metabolic diseases complicating pregnancy, unspecified trimester: Secondary | ICD-10-CM

## 2016-08-11 DIAGNOSIS — Z3A38 38 weeks gestation of pregnancy: Secondary | ICD-10-CM | POA: Insufficient documentation

## 2016-08-11 DIAGNOSIS — D696 Thrombocytopenia, unspecified: Secondary | ICD-10-CM | POA: Diagnosis not present

## 2016-08-11 DIAGNOSIS — O2623 Pregnancy care for patient with recurrent pregnancy loss, third trimester: Secondary | ICD-10-CM | POA: Insufficient documentation

## 2016-08-11 DIAGNOSIS — O24419 Gestational diabetes mellitus in pregnancy, unspecified control: Secondary | ICD-10-CM | POA: Insufficient documentation

## 2016-08-11 DIAGNOSIS — O99113 Other diseases of the blood and blood-forming organs and certain disorders involving the immune mechanism complicating pregnancy, third trimester: Secondary | ICD-10-CM | POA: Insufficient documentation

## 2016-08-11 DIAGNOSIS — O0993 Supervision of high risk pregnancy, unspecified, third trimester: Secondary | ICD-10-CM

## 2016-08-11 HISTORY — DX: Gestational diabetes mellitus in pregnancy, unspecified control: O24.419

## 2016-08-11 NOTE — Addendum Note (Signed)
Encounter addended by: Vivien Rotaachael H Hodan Wurtz, RT on: 08/11/2016 10:11 AM<BR>    Actions taken: Imaging Exam ended

## 2016-08-17 ENCOUNTER — Encounter (HOSPITAL_COMMUNITY): Payer: Self-pay | Admitting: *Deleted

## 2016-08-17 ENCOUNTER — Ambulatory Visit (INDEPENDENT_AMBULATORY_CARE_PROVIDER_SITE_OTHER): Payer: Medicaid Other | Admitting: Clinical

## 2016-08-17 ENCOUNTER — Telehealth (HOSPITAL_COMMUNITY): Payer: Self-pay | Admitting: *Deleted

## 2016-08-17 ENCOUNTER — Ambulatory Visit (INDEPENDENT_AMBULATORY_CARE_PROVIDER_SITE_OTHER): Payer: Medicaid Other | Admitting: Obstetrics and Gynecology

## 2016-08-17 VITALS — BP 129/83 | HR 91 | Wt 159.0 lb

## 2016-08-17 DIAGNOSIS — O99280 Endocrine, nutritional and metabolic diseases complicating pregnancy, unspecified trimester: Principal | ICD-10-CM

## 2016-08-17 DIAGNOSIS — D696 Thrombocytopenia, unspecified: Secondary | ICD-10-CM

## 2016-08-17 DIAGNOSIS — E039 Hypothyroidism, unspecified: Secondary | ICD-10-CM

## 2016-08-17 DIAGNOSIS — F4323 Adjustment disorder with mixed anxiety and depressed mood: Secondary | ICD-10-CM

## 2016-08-17 DIAGNOSIS — O99113 Other diseases of the blood and blood-forming organs and certain disorders involving the immune mechanism complicating pregnancy, third trimester: Secondary | ICD-10-CM

## 2016-08-17 DIAGNOSIS — O99283 Endocrine, nutritional and metabolic diseases complicating pregnancy, third trimester: Secondary | ICD-10-CM

## 2016-08-17 DIAGNOSIS — O0993 Supervision of high risk pregnancy, unspecified, third trimester: Secondary | ICD-10-CM

## 2016-08-17 DIAGNOSIS — O24410 Gestational diabetes mellitus in pregnancy, diet controlled: Secondary | ICD-10-CM

## 2016-08-17 LAB — POCT URINALYSIS DIP (DEVICE)
Bilirubin Urine: NEGATIVE
GLUCOSE, UA: NEGATIVE mg/dL
Hgb urine dipstick: NEGATIVE
Ketones, ur: NEGATIVE mg/dL
LEUKOCYTES UA: NEGATIVE
NITRITE: NEGATIVE
Protein, ur: NEGATIVE mg/dL
SPECIFIC GRAVITY, URINE: 1.02 (ref 1.005–1.030)
UROBILINOGEN UA: 0.2 mg/dL (ref 0.0–1.0)
pH: 7 (ref 5.0–8.0)

## 2016-08-17 NOTE — Addendum Note (Signed)
Addended by: Hulda MarinMCMANNES, JAMIE C on: 08/17/2016 02:11 PM   Modules accepted: Orders

## 2016-08-17 NOTE — Progress Notes (Signed)
IOL scheduled 11/1 @ 630

## 2016-08-17 NOTE — BH Specialist Note (Signed)
Session Start time: 12:21   End Time: 12:45 Total Time:  24 minutes Type of Service: Behavioral Health - Individual/Family Interpreter: No.   Interpreter Name & Language: n/a # [facility]   SUBJECTIVE: Ashley Rhodes is a 30 y.o. female  Pt. was referred by Nettie ElmMichael Ervin, MD for:  anxiety and depression. Pt. reports the following symptoms/concerns: Pt states that she is feeling anxious (and excited) as she gets nearer to giving birth to first baby, as well as some sleep difficulty and tiredness. Duration of problem:  Increasing in past month Severity: mild Previous treatment: none   OBJECTIVE: Mood: Anxious & Affect: Appropriate Risk of harm to self or others: no known risk of harm to self or others Assessments administered: PHQ9: 10/ GAD7: 11  LIFE CONTEXT:  Family & Social: Lives with husband(also in office visit, shares both anxiety and excitement over upcoming birth); supportive friends and family School/ Work:  Self-Care: Trouble staying asleep, eating well, no substances Life changes: Current pregnancy What is important to pt/family (values): Preparation for upcoming birth   GOALS ADDRESSED:  -Alleviate symptoms of anxiety and depression  INTERVENTIONS: Strength-based and Meditation: CALM relaxation breathing   ASSESSMENT:  Pt currently experiencing Adjustment disorder with mixed anxious and depressed mood.  Pt may benefit from psychoeducation and brief thereapeutic intervention regarding coping with symptoms of anxiety and depression.      PLAN: 1. F/U with behavioral health clinician: At postpartum visit, or as needed 2. Behavioral Health meds: none 3. Behavioral recommendations:  -Consider reading and filling out Postpartum Plan together with husband -Consider daily CALM relaxation breathing exercise, as needed throughout the day -Read educational material regarding coping with symptoms of anxiety and depression -Consider mom  [facility] 4. Referral: Brief Counseling/Psychotherapy and Psychoeducation   Rae LipsJamie C Jesiel Garate LCSWA Behavioral Health Clinician  Marlon PelWarmhandoff:   Warm Hand Off Completed.        Depression screen PHQ 2/9 08/17/2016  Decreased Interest 2  Down, Depressed, Hopeless 0  PHQ - 2 Score 2  Altered sleeping 3  Tired, decreased energy 3  Change in appetite 0  Feeling bad or failure about yourself  0  Trouble concentrating 0  Moving slowly or fidgety/restless 2  Suicidal thoughts 0  PHQ-9 Score 10   GAD 7 : Generalized Anxiety Score 08/17/2016  Nervous, Anxious, on Edge 3  Control/stop worrying 2  Worry too much - different things 0  Trouble relaxing 2  Restless 2  Easily annoyed or irritable 1  Afraid - awful might happen 1  Total GAD 7 Score 11

## 2016-08-17 NOTE — Progress Notes (Signed)
Subjective:  Ashley Rhodes is a 30 y.o. G5P0040 at 325w6d being seen today for ongoing prenatal care.  She is currently monitored for the following issues for this high-risk pregnancy and has Uterine synechiae; Hypothyroidism affecting pregnancy, antepartum; Supervision of high-risk pregnancy; Gestational thrombocytopenia (HCC); and GDM (gestational diabetes mellitus) on her problem list.  Patient reports no complaints.  Contractions: Irregular. Vag. Bleeding: None.  Movement: Present. Denies leaking of fluid.   The following portions of the patient's history were reviewed and updated as appropriate: allergies, current medications, past family history, past medical history, past social history, past surgical history and problem list. Problem list updated.  Objective:   Vitals:   08/17/16 1134  BP: 129/83  Pulse: 91  Weight: 159 lb (72.1 kg)    Fetal Status: Fetal Heart Rate (bpm): 163   Movement: Present     General:  Alert, oriented and cooperative. Patient is in no acute distress.  Skin: Skin is warm and dry. No rash noted.   Cardiovascular: Normal heart rate noted  Respiratory: Normal respiratory effort, no problems with respiration noted  Abdomen: Soft, gravid, appropriate for gestational age. Pain/Pressure: Present     Pelvic:  Cervical exam deferred        Extremities: Normal range of motion.  Edema: None  Mental Status: Normal mood and affect. Normal behavior. Normal judgment and thought content.   Urinalysis: Urine Protein: Negative Urine Glucose: Negative  Assessment and Plan:  Pregnancy: G5P0040 at 605w6d  1. Hypothyroidism affecting pregnancy, antepartum Stable  Continue with Synthroid   2. Diet controlled gestational diabetes mellitus (GDM) in third trimester Late Dx. BS for the most part in goal range. Diet reviewed again U/S last week BPP 8/8 EFW 3184 gms 63 %  3. Supervision of high risk pregnancy in third trimester IOL at 40 wks  4. Benign  gestational thrombocytopenia in third trimester [facility](HCC) Will check CBC today  Term labor symptoms and general obstetric precautions including but not limited to vaginal bleeding, contractions, leaking of fluid and fetal movement were reviewed in detail with the patient. Please refer to After Visit Summary for other counseling recommendations.  No Follow-up on file.   Hermina StaggersMichael L Jermiah Soderman, MD

## 2016-08-17 NOTE — Telephone Encounter (Signed)
Preadmission screen  

## 2016-08-17 NOTE — Patient Instructions (Signed)
Vaginal Delivery °During delivery, your health care provider will help you give birth to your baby. During a vaginal delivery, you will work to push the baby out of your vagina. However, before you can push your baby out, a few things need to happen. The opening of your uterus (cervix) has to soften, thin out, and open up (dilate) all the way to 10 cm. Also, your baby has to move down from the uterus into your vagina.  °SIGNS OF LABOR  °Your health care provider will first need to make sure you are in labor. Signs of labor include:  °· Passing what is called the mucous plug before labor begins. This is a small amount of blood-stained mucus. °· Having regular, painful uterine contractions.   °· The time between contractions gets shorter.   °· The discomfort and pain gradually get more intense. °· Contraction pains get worse when walking and do not go away when resting.   °· Your cervix becomes thinner (effacement) and dilates. °BEFORE THE DELIVERY °Once you are in labor and admitted into the hospital or care center, your health care provider may do the following:  °· Perform a complete physical exam. °· Review any complications related to pregnancy or labor.  °· Check your blood pressure, pulse, temperature, and heart rate (vital signs).   °· Determine if, and when, the rupture of amniotic membranes occurred. °· Do a vaginal exam (using a sterile glove and lubricant) to determine:   °¨ The position (presentation) of the baby. Is the baby's head presenting first (vertex) in the birth canal (vagina), or are the feet or buttocks first (breech)?   °¨ The level (station) of the baby's head within the birth canal.   °¨ The effacement and dilatation of the cervix.   °· An electronic fetal monitor is usually placed on your abdomen when you first arrive. This is used to monitor your contractions and the baby's heart rate. °¨ When the monitor is on your abdomen (external fetal monitor), it can only pick up the frequency and  length of your contractions. It cannot tell the strength of your contractions. °¨ If it becomes necessary for your health care provider to know exactly how strong your contractions are or to see exactly what the baby's heart rate is doing, an internal monitor may be inserted into your vagina and uterus. Your health care provider will discuss the benefits and risks of using an internal monitor and obtain your permission before inserting the device. °¨ Continuous fetal monitoring may be needed if you have an epidural, are receiving certain medicines (such as oxytocin), or have pregnancy or labor complications. °· An IV access tube may be placed into a vein in your arm to deliver fluids and medicines if necessary. °THREE STAGES OF LABOR AND DELIVERY °Normal labor and delivery is divided into three stages. °First Stage °This stage starts when you begin to contract regularly and your cervix begins to efface and dilate. It ends when your cervix is completely open (fully dilated). The first stage is the longest stage of labor and can last from 3 hours to 15 hours.  °Several methods are available to help with labor pain. You and your health care provider will decide which option is best for you. Options include:  °· Opioid medicines. These are strong pain medicines that you can get through your IV tube or as a shot into your muscle. These medicines lessen pain but do not make it go away completely.  °· Epidural. A medicine is given through a thin tube that   is inserted in your back. The medicine numbs the lower part of your body and prevents any pain in that area. °· Paracervical pain medicine. This is an injection of an anesthetic on each side of your cervix.   °· You may request natural childbirth, which does not involve the use of pain medicines or an epidural during labor and delivery. Instead, you will use other things, such as breathing exercises, to help cope with the pain. °Second Stage °The second stage of labor  begins when your cervix is fully dilated at 10 cm. It continues until you push your baby down through the birth canal and the baby is born. This stage can take only minutes or several hours. °· The location of your baby's head as it moves through the birth canal is reported as a number called a station. If the baby's head has not started its descent, the station is described as being at minus 3 (-3). When your baby's head is at the zero station, it is at the middle of the birth canal and is engaged in the pelvis. The station of your baby helps indicate the progress of the second stage of labor. °· When your baby is born, your health care provider may hold the baby with his or her head lowered to prevent amniotic fluid, mucus, and blood from getting into the baby's lungs. The baby's mouth and nose may be suctioned with a small bulb syringe to remove any additional fluid. °· Your health care provider may then place the baby on your stomach. It is important to keep the baby from getting cold. To do this, the health care provider will dry the baby off, place the baby directly on your skin (with no blankets between you and the baby), and cover the baby with warm, dry blankets.   °· The umbilical cord is cut. °Third Stage °During the third stage of labor, your health care provider will deliver the placenta (afterbirth) and make sure your bleeding is under control. The delivery of the placenta usually takes about 5 minutes but can take up to 30 minutes. After the placenta is delivered, a medicine may be given either by IV or injection to help contract the uterus and control bleeding. If you are planning to breastfeed, you can try to do so now. °After you deliver the placenta, your uterus should contract and get very firm. If your uterus does not remain firm, your health care provider will massage it. This is important because the contraction of the uterus helps cut off bleeding at the site where the placenta was attached  to your uterus. If your uterus does not contract properly and stay firm, you may continue to bleed heavily. If there is a lot of bleeding, medicines may be given to contract the uterus and stop the bleeding.  °  °This information is not intended to replace advice given to you by your health care provider. Make sure you discuss any questions you have with your health care provider. °  °Document Released: 07/21/2008 Document Revised: 11/02/2014 Document Reviewed: 06/08/2012 °Elsevier Interactive Patient Education ©2016 [employer] ° °

## 2016-08-19 ENCOUNTER — Ambulatory Visit (INDEPENDENT_AMBULATORY_CARE_PROVIDER_SITE_OTHER): Payer: Self-pay | Admitting: Pediatrics

## 2016-08-19 DIAGNOSIS — Z3490 Encounter for supervision of normal pregnancy, unspecified, unspecified trimester: Secondary | ICD-10-CM

## 2016-08-19 DIAGNOSIS — Z7681 Expectant parent(s) prebirth pediatrician visit: Secondary | ICD-10-CM

## 2016-08-19 NOTE — Progress Notes (Signed)
Prenatal counseling for impending newborn done-- Z76.81  

## 2016-08-20 ENCOUNTER — Encounter: Payer: Self-pay | Admitting: General Practice

## 2016-08-25 ENCOUNTER — Ambulatory Visit (INDEPENDENT_AMBULATORY_CARE_PROVIDER_SITE_OTHER): Payer: Medicaid Other | Admitting: Obstetrics and Gynecology

## 2016-08-25 VITALS — BP 100/74 | HR 72 | Wt 158.6 lb

## 2016-08-25 DIAGNOSIS — O24410 Gestational diabetes mellitus in pregnancy, diet controlled: Secondary | ICD-10-CM

## 2016-08-25 NOTE — Patient Instructions (Signed)
Fetal Movement Counts  Patient Name: __________________________________________________ Patient Due Date: ____________________  Performing a fetal movement count is highly recommended in high-risk pregnancies, but it is good for every pregnant woman to do. Your health care provider may ask you to start counting fetal movements at 28 weeks of the pregnancy. Fetal movements often increase:  · After eating a full meal.  · After physical activity.  · After eating or drinking something sweet or cold.  · At rest.  Pay attention to when you feel the baby is most active. This will help you notice a pattern of your baby's sleep and wake cycles and what factors contribute to an increase in fetal movement. It is important to perform a fetal movement count at the same time each day when your baby is normally most active.   HOW TO COUNT FETAL MOVEMENTS  1. Find a quiet and comfortable area to sit or lie down on your left side. Lying on your left side provides the best blood and oxygen circulation to your baby.  2. Write down the day and time on a sheet of paper or in a journal.  3. Start counting kicks, flutters, swishes, rolls, or jabs in a 2-hour period. You should feel at least 10 movements within 2 hours.  4. If you do not feel 10 movements in 2 hours, wait 2-3 hours and count again. Look for a change in the pattern or not enough counts in 2 hours.  SEEK MEDICAL CARE IF:  · You feel less than 10 counts in 2 hours, tried twice.  · There is no movement in over an hour.  · The pattern is changing or taking longer each day to reach 10 counts in 2 hours.  · You feel the baby is not moving as he or she usually does.  Date: ____________ Movements: ____________ Start time: ____________ Finish time: ____________   Date: ____________ Movements: ____________ Start time: ____________ Finish time: ____________  Date: ____________ Movements: ____________ Start time: ____________ Finish time: ____________  Date: ____________ Movements:  ____________ Start time: ____________ Finish time: ____________  Date: ____________ Movements: ____________ Start time: ____________ Finish time: ____________  Date: ____________ Movements: ____________ Start time: ____________ Finish time: ____________  Date: ____________ Movements: ____________ Start time: ____________ Finish time: ____________  Date: ____________ Movements: ____________ Start time: ____________ Finish time: ____________   Date: ____________ Movements: ____________ Start time: ____________ Finish time: ____________  Date: ____________ Movements: ____________ Start time: ____________ Finish time: ____________  Date: ____________ Movements: ____________ Start time: ____________ Finish time: ____________  Date: ____________ Movements: ____________ Start time: ____________ Finish time: ____________  Date: ____________ Movements: ____________ Start time: ____________ Finish time: ____________  Date: ____________ Movements: ____________ Start time: ____________ Finish time: ____________  Date: ____________ Movements: ____________ Start time: ____________ Finish time: ____________   Date: ____________ Movements: ____________ Start time: ____________ Finish time: ____________  Date: ____________ Movements: ____________ Start time: ____________ Finish time: ____________  Date: ____________ Movements: ____________ Start time: ____________ Finish time: ____________  Date: ____________ Movements: ____________ Start time: ____________ Finish time: ____________  Date: ____________ Movements: ____________ Start time: ____________ Finish time: ____________  Date: ____________ Movements: ____________ Start time: ____________ Finish time: ____________  Date: ____________ Movements: ____________ Start time: ____________ Finish time: ____________   Date: ____________ Movements: ____________ Start time: ____________ Finish time: ____________  Date: ____________ Movements: ____________ Start time: ____________ Finish  time: ____________  Date: ____________ Movements: ____________ Start time: ____________ Finish time: ____________  Date: ____________ Movements: ____________ Start time:   ____________ Finish time: ____________  Date: ____________ Movements: ____________ Start time: ____________ Finish time: ____________  Date: ____________ Movements: ____________ Start time: ____________ Finish time: ____________  Date: ____________ Movements: ____________ Start time: ____________ Finish time: ____________   Date: ____________ Movements: ____________ Start time: ____________ Finish time: ____________  Date: ____________ Movements: ____________ Start time: ____________ Finish time: ____________  Date: ____________ Movements: ____________ Start time: ____________ Finish time: ____________  Date: ____________ Movements: ____________ Start time: ____________ Finish time: ____________  Date: ____________ Movements: ____________ Start time: ____________ Finish time: ____________  Date: ____________ Movements: ____________ Start time: ____________ Finish time: ____________  Date: ____________ Movements: ____________ Start time: ____________ Finish time: ____________   Date: ____________ Movements: ____________ Start time: ____________ Finish time: ____________  Date: ____________ Movements: ____________ Start time: ____________ Finish time: ____________  Date: ____________ Movements: ____________ Start time: ____________ Finish time: ____________  Date: ____________ Movements: ____________ Start time: ____________ Finish time: ____________  Date: ____________ Movements: ____________ Start time: ____________ Finish time: ____________  Date: ____________ Movements: ____________ Start time: ____________ Finish time: ____________  Date: ____________ Movements: ____________ Start time: ____________ Finish time: ____________   Date: ____________ Movements: ____________ Start time: ____________ Finish time: ____________  Date: ____________  Movements: ____________ Start time: ____________ Finish time: ____________  Date: ____________ Movements: ____________ Start time: ____________ Finish time: ____________  Date: ____________ Movements: ____________ Start time: ____________ Finish time: ____________  Date: ____________ Movements: ____________ Start time: ____________ Finish time: ____________  Date: ____________ Movements: ____________ Start time: ____________ Finish time: ____________  Date: ____________ Movements: ____________ Start time: ____________ Finish time: ____________   Date: ____________ Movements: ____________ Start time: ____________ Finish time: ____________  Date: ____________ Movements: ____________ Start time: ____________ Finish time: ____________  Date: ____________ Movements: ____________ Start time: ____________ Finish time: ____________  Date: ____________ Movements: ____________ Start time: ____________ Finish time: ____________  Date: ____________ Movements: ____________ Start time: ____________ Finish time: ____________  Date: ____________ Movements: ____________ Start time: ____________ Finish time: ____________     This information is not intended to replace advice given to you by your health care provider. Make sure you discuss any questions you have with your health care provider.     Document Released: 11/11/2006 Document Revised: 11/02/2014 Document Reviewed: 08/08/2012  Elsevier Interactive Patient Education ©2016 [employer]

## 2016-08-25 NOTE — Progress Notes (Addendum)
   PRENATAL VISIT NOTE  Subjective:  Ashley Rhodes is a 30 y.o. G5P0040 at 779w0d being seen today for ongoing prenatal care.  She is currently monitored for the following issues for this high-risk pregnancy and has Uterine synechiae; Hypothyroidism affecting pregnancy, antepartum; Supervision of high-risk pregnancy; Gestational thrombocytopenia (HCC); and GDM (gestational diabetes mellitus) on her problem list.  Patient reports no complaints.  Contractions: Irregular.  .  Movement: Present. Denies leaking of fluid.   The following portions of the patient's history were reviewed and updated as appropriate: allergies, current medications, past family history, past medical history, past social history, past surgical history and problem list. Problem list updated. CBGs all within target range except 3 PP lunch (145, 151,153) and 1 PP dinner (163)> reviewed diet.  Objective:   Vitals:   08/25/16 0806  BP: 100/74  Pulse: 72  Weight: 158 lb 9.6 oz (71.9 kg)    Fetal Status: Fetal Heart Rate (bpm): 126   Movement: Present     General:  Alert, oriented and cooperative. Patient is in no acute distress.  Skin: Skin is warm and dry. No rash noted.   Cardiovascular: Normal heart rate noted  Respiratory: Normal respiratory effort, no problems with respiration noted  Abdomen: Soft, gravid, appropriate for gestational age. Pain/Pressure: Present     Pelvic:  Cervical exam deferred      Declines exam until tomorrow am  Extremities: Normal range of motion.  Edema: None  Mental Status: Normal mood and affect. Normal behavior. Normal judgment and thought content.   Assessment and Plan:  Pregnancy: G5P0040 at 10979w0d  1. Diet controlled gestational diabetes mellitus (GDM) in third trimester Good control  Term labor symptoms and general obstetric precautions including but not limited to vaginal bleeding, contractions, leaking of fluid and fetal movement were reviewed in detail with the  patient. Fetal movement perception reviewed Please refer to After Visit Summary for other counseling recommendations.  Return in about 1 day (around 08/26/2016) for IOL tomorrow.  Danae Orleanseirdre C Poe, CNM

## 2016-08-26 ENCOUNTER — Inpatient Hospital Stay (HOSPITAL_COMMUNITY)
Admission: RE | Admit: 2016-08-26 | Discharge: 2016-08-30 | DRG: 765 | Disposition: A | Discharge status: Home / Self Care | Payer: Medicaid Other | Source: Ambulatory Visit | Attending: Family Medicine | Admitting: Family Medicine

## 2016-08-26 ENCOUNTER — Encounter (HOSPITAL_COMMUNITY): Payer: Self-pay | Admitting: *Deleted

## 2016-08-26 ENCOUNTER — Inpatient Hospital Stay (HOSPITAL_COMMUNITY)
Admission: RE | Admit: 2016-08-26 | Discharge: 2016-08-26 | Disposition: A | Discharge status: Home / Self Care | Payer: Medicaid Other | Source: Ambulatory Visit | Attending: Family Medicine | Admitting: Family Medicine

## 2016-08-26 DIAGNOSIS — Z3A40 40 weeks gestation of pregnancy: Secondary | ICD-10-CM | POA: Diagnosis not present

## 2016-08-26 DIAGNOSIS — E039 Hypothyroidism, unspecified: Secondary | ICD-10-CM | POA: Diagnosis present

## 2016-08-26 DIAGNOSIS — O99214 Obesity complicating childbirth: Secondary | ICD-10-CM | POA: Diagnosis present

## 2016-08-26 DIAGNOSIS — O9912 Other diseases of the blood and blood-forming organs and certain disorders involving the immune mechanism complicating childbirth: Secondary | ICD-10-CM | POA: Diagnosis present

## 2016-08-26 DIAGNOSIS — O24429 Gestational diabetes mellitus in childbirth, unspecified control: Secondary | ICD-10-CM | POA: Diagnosis not present

## 2016-08-26 DIAGNOSIS — O24420 Gestational diabetes mellitus in childbirth, diet controlled: Principal | ICD-10-CM | POA: Diagnosis present

## 2016-08-26 DIAGNOSIS — Z833 Family history of diabetes mellitus: Secondary | ICD-10-CM | POA: Diagnosis not present

## 2016-08-26 DIAGNOSIS — D6959 Other secondary thrombocytopenia: Secondary | ICD-10-CM | POA: Diagnosis present

## 2016-08-26 DIAGNOSIS — Z6832 Body mass index (BMI) 32.0-32.9, adult: Secondary | ICD-10-CM | POA: Diagnosis not present

## 2016-08-26 DIAGNOSIS — O99284 Endocrine, nutritional and metabolic diseases complicating childbirth: Secondary | ICD-10-CM | POA: Diagnosis present

## 2016-08-26 DIAGNOSIS — O24415 Gestational diabetes mellitus in pregnancy, controlled by oral hypoglycemic drugs: Secondary | ICD-10-CM | POA: Diagnosis present

## 2016-08-26 DIAGNOSIS — O368330 Maternal care for abnormalities of the fetal heart rate or rhythm, third trimester, not applicable or unspecified: Secondary | ICD-10-CM | POA: Diagnosis present

## 2016-08-26 DIAGNOSIS — O0993 Supervision of high risk pregnancy, unspecified, third trimester: Secondary | ICD-10-CM

## 2016-08-26 DIAGNOSIS — Z98891 History of uterine scar from previous surgery: Secondary | ICD-10-CM

## 2016-08-26 LAB — CBC
HCT: 37 % (ref 36.0–46.0)
HEMOGLOBIN: 12.5 g/dL (ref 12.0–15.0)
MCH: 30.6 pg (ref 26.0–34.0)
MCHC: 33.8 g/dL (ref 30.0–36.0)
MCV: 90.5 fL (ref 78.0–100.0)
Platelets: 116 10*3/uL — ABNORMAL LOW (ref 150–400)
RBC: 4.09 MIL/uL (ref 3.87–5.11)
RDW: 15.2 % (ref 11.5–15.5)
WBC: 8.9 10*3/uL (ref 4.0–10.5)

## 2016-08-26 LAB — HIV ANTIBODY (ROUTINE TESTING W REFLEX): HIV Screen 4th Generation wRfx: NONREACTIVE

## 2016-08-26 LAB — TYPE AND SCREEN
ABO/RH(D): O POS
Antibody Screen: NEGATIVE

## 2016-08-26 LAB — RPR: RPR Ser Ql: NONREACTIVE

## 2016-08-26 LAB — ABO/RH: ABO/RH(D): O POS

## 2016-08-26 LAB — GLUCOSE, CAPILLARY: Glucose-Capillary: 83 mg/dL (ref 65–99)

## 2016-08-26 MED ORDER — SOD CITRATE-CITRIC ACID 500-334 MG/5ML PO SOLN
30.0000 mL | ORAL | Status: DC | PRN
  Administered 2016-08-27: 30 mL via ORAL
  Filled 2016-08-26: qty 15

## 2016-08-26 MED ORDER — MISOPROSTOL 25 MCG QUARTER TABLET: 25.0000 ug | ORAL_TABLET | ORAL | Status: DC | PRN

## 2016-08-26 MED ORDER — LACTATED RINGERS IV SOLN
INTRAVENOUS | Status: DC
  Administered 2016-08-26: 08:00:00 via INTRAVENOUS

## 2016-08-26 MED ORDER — OXYTOCIN BOLUS FROM INFUSION: 500.0000 mL | Freq: Once | INTRAVENOUS | Status: DC

## 2016-08-26 MED ORDER — LACTATED RINGERS IV SOLN: 500.0000 mL | INTRAVENOUS | Status: DC | PRN

## 2016-08-26 MED ORDER — MISOPROSTOL 50MCG HALF TABLET
50.0000 ug | ORAL_TABLET | ORAL | Status: DC | PRN
  Administered 2016-08-26 (×3): 50 ug via ORAL
  Filled 2016-08-26 (×3): qty 0.5

## 2016-08-26 MED ORDER — FLEET ENEMA 7-19 GM/118ML RE ENEM: 1.0000 | ENEMA | RECTAL | Status: DC | PRN

## 2016-08-26 MED ORDER — LIDOCAINE HCL (PF) 1 % IJ SOLN
30.0000 mL | INTRAMUSCULAR | Status: DC | PRN
  Filled 2016-08-26: qty 30

## 2016-08-26 MED ORDER — ONDANSETRON HCL 4 MG/2ML IJ SOLN: 4.0000 mg | Freq: Four times a day (QID) | INTRAMUSCULAR | Status: DC | PRN

## 2016-08-26 MED ORDER — OXYTOCIN 40 UNITS IN LACTATED RINGERS INFUSION - SIMPLE MED: 2.5000 [IU]/h | INTRAVENOUS | Status: DC

## 2016-08-26 MED ORDER — TERBUTALINE SULFATE 1 MG/ML IJ SOLN: 0.2500 mg | Freq: Once | INTRAMUSCULAR | Status: DC | PRN

## 2016-08-26 MED ORDER — LEVOTHYROXINE SODIUM 50 MCG PO TABS
50.0000 ug | ORAL_TABLET | Freq: Every day | ORAL | Status: DC
Start: 2016-08-26 — End: 2016-08-27
  Administered 2016-08-26: 50 ug via ORAL
  Filled 2016-08-26 (×3): qty 1

## 2016-08-26 MED ORDER — MISOPROSTOL 25 MCG QUARTER TABLET
25.0000 ug | ORAL_TABLET | ORAL | Status: DC
  Administered 2016-08-26 – 2016-08-27 (×2): 25 ug via VAGINAL
  Filled 2016-08-26: qty 1
  Filled 2016-08-26: qty 0.25
  Filled 2016-08-26 (×2): qty 1
  Filled 2016-08-26: qty 0.25
  Filled 2016-08-26 (×2): qty 1

## 2016-08-26 MED ORDER — LEVOTHYROXINE SODIUM 200 MCG PO TABS: 500.0000 ug | ORAL_TABLET | Freq: Every day | ORAL | Status: DC

## 2016-08-26 MED ORDER — OXYCODONE-ACETAMINOPHEN 5-325 MG PO TABS: 1.0000 | ORAL_TABLET | ORAL | Status: DC | PRN

## 2016-08-26 MED ORDER — OXYCODONE-ACETAMINOPHEN 5-325 MG PO TABS: 2.0000 | ORAL_TABLET | ORAL | Status: DC | PRN

## 2016-08-26 MED ORDER — ACETAMINOPHEN 325 MG PO TABS: 650.0000 mg | ORAL_TABLET | ORAL | Status: DC | PRN

## 2016-08-26 NOTE — Anesthesia Pain Management Evaluation Note (Signed)
  CRNA Pain Management Visit Note  Patient: Ashley Rhodes, 30 y.o., female  "Hello I am a member of the anesthesia team at [facility]. We have an anesthesia team available at all times to provide care throughout the hospital, including epidural management and anesthesia for C-section. I don't know your plan for the delivery whether it a natural birth, water birth, IV sedation, nitrous supplementation, doula or epidural, but we want to meet your pain goals."   1.Was your pain managed to your expectations on prior hospitalizations?   No prior hospitalizations  2.What is your expectation for pain management during this hospitalization?     Epidural  3.How can we help you reach that goal?   Record the patient's initial score and the patient's pain goal.   Pain: 0  Pain Goal: 4 [facility] wants you to be able to say your pain was always managed very well.  Ashley Rhodes,Ashley Rhodes 08/26/2016

## 2016-08-26 NOTE — H&P (Signed)
LABOR AND DELIVERY ADMISSION HISTORY AND PHYSICAL NOTE  Ashley Rhodes is a 30 y.o. female [id number] with IUP at 78w1dby 8 week [place]   She is feeling well, denies pain and contractions.  She reports positive fetal movement. She denies leakage of fluid or vaginal bleeding.  Prenatal History/Complications:  Past Medical History: Past Medical History:  Diagnosis Date  . Four previous spontaneous abortions (SAB) affecting care of mother, antepartum   . Gestational diabetes   . Hypothyroidism     Past Surgical History: Past Surgical History:  Procedure Laterality Date  . DILATION AND CURETTAGE OF UTERUS      Obstetrical History: OB History    Gravida Para Term Preterm AB Living   5       4     SAB TAB Ectopic Multiple Live Births   4              Social History: Social History   Social History  . Marital status: Married    Spouse name: N/A  . Number of children: N/A  . Years of education: N/A   Social History Main Topics  . Smoking status: Never Smoker  . Smokeless tobacco: Never Used  . Alcohol use No  . Drug use: No  . Sexual activity: No   Other Topics Concern  . None   Social History Narrative  . None    Family History: Family History  Problem Relation Age of Onset  . Diabetes Mother   . Diabetes Father     Allergies: No Known Allergies  Prescriptions Prior to Admission  Medication Sig Dispense Refill Last Dose  . docusate sodium (COLACE) 100 MG capsule Take 1 capsule (100 mg total) by mouth 2 (two) times daily as needed. (Patient taking differently: Take 100 mg by mouth 2 (two) times daily as needed for mild constipation. ) 30 capsule 2 08/25/2016 at Unknown time  . levothyroxine (SYNTHROID) 50 MCG tablet Take 10 tablets (500 mcg total) by mouth daily. (Patient taking differently: Take 50 mcg by mouth daily. ) 30 tablet 2 08/25/2016 at Unknown time  . Prenatal Vit-Fe Fumarate-FA (PRENATAL MULTIVITAMIN) TABS  tablet Take 1 tablet by mouth daily at 12 noon.   08/25/2016 at Unknown time  . ACCU-CHEK FASTCLIX LANCETS MISC 1 each by Percutaneous route 4 (four) times daily. 100 each 12 Taking  . Blood Glucose Monitoring Suppl (ACCU-CHEK AVIVA PLUS) w/Device KIT 1 each by Does not apply route 4 (four) times daily. 1 kit 0 Taking  . glucose blood test strip Use as instructed 50 each 12 Taking     Review of Systems   All systems reviewed and negative except as stated in HPI  Height 4' 11"  (1.499 m), weight 71.9 kg (158 lb 9 oz), last menstrual period 11/19/2015, unknown if currently breastfeeding. General appearance: alert, cooperative and no distress Lungs: no respiratory distress Heart: regular rate Abdomen: soft, non-tender Extremities: No calf swelling or tenderness Presentation: cephalic by cervical exam Fetal monitoring: baseline 125, moderate variability, accelerations present, no decelerations Uterine activity: none Dilation: Closed Effacement (%): Thick Station: -2 Exam by:: Davis,RN   Prenatal labs: ABO, Rh: --/--/O POS (11/01 0805) Antibody: PENDING (11/01 0805) Rubella: immune RPR: NON REAC (09/18 1643)  HBsAg: Negative (04/18 0000)  HIV: NONREACTIVE (09/18 1643)  GBS: Negative (10/03 0000)   Prenatal Transfer Tool  Maternal Diabetes: Yes:  Diabetes Type:  Diet controlled Genetic Screening: Declined Maternal Ultrasounds/Referrals: Normal Fetal Ultrasounds  or other Referrals:  Referred to Materal Fetal Medicine  Maternal Substance Abuse:  No Significant Maternal Medications:  Meds include: Syntroid Significant Maternal Lab Results: None  Results for orders placed or performed during the hospital encounter of 08/26/16 (from the past 24 hour(s))  CBC   Collection Time: 08/26/16  8:05 AM  Result Value Ref Range   WBC 8.9 4.0 - 10.5 K/uL   RBC 4.09 3.87 - 5.11 MIL/uL   Hemoglobin 12.5 12.0 - 15.0 g/dL   HCT 37.0 36.0 - 46.0 %   MCV 90.5 78.0 - 100.0 fL   MCH 30.6 26.0 -  34.0 pg   MCHC 33.8 30.0 - 36.0 g/dL   RDW 15.2 11.5 - 15.5 %   Platelets 116 (L) 150 - 400 K/uL  Type and screen Ashley Rhodes   Collection Time: 08/26/16  8:05 AM  Result Value Ref Range   ABO/RH(D) O POS    Antibody Screen PENDING    Sample Expiration 08/29/2016     Patient Active Problem List   Diagnosis Date Noted  . Gestational diabetes, diet controlled 08/26/2016  . Gestational thrombocytopenia ([place]) 07/21/2016  . GDM (gestational diabetes mellitus) 07/21/2016  . 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 Hypothyroidism affecting pregnancy, antepartum 05/06/2016  . Supervision of high-risk pregnancy 05/06/2016    Assessment: Ashley Rhodes is a 30 y.o. [id number] at 79w1dhere for IOL secondary to GDMA1  #Labor: IOL for GDMA1 #Pain: Desires epidural #FWB: Category 1 tracing  #ID:  GBS negative #MOF: breast and bottle #MOC: condoms #Circ:  yes  ASteve Rattler DO PGY-1 11/1/20179:27 AM    OB FELLOW HISTORY AND PHYSICAL ATTESTATION  I have seen and examined this patient; I agree with above documentation in the resident's note.    NJacquiline Doe11/10/2015, 1:52 PM

## 2016-08-26 NOTE — Progress Notes (Signed)
Patient seen. Doing well. Cervix 1/T/posterior. No other complaints. Attempted foley. Cervix too posterior. Attempt another cytotec at this time.

## 2016-08-27 ENCOUNTER — Inpatient Hospital Stay (HOSPITAL_COMMUNITY): Payer: Medicaid Other | Admitting: Anesthesiology

## 2016-08-27 ENCOUNTER — Encounter (HOSPITAL_COMMUNITY)
Admission: RE | Disposition: A | Discharge status: Home / Self Care | Payer: Self-pay | Source: Ambulatory Visit | Attending: Obstetrics & Gynecology

## 2016-08-27 ENCOUNTER — Encounter (HOSPITAL_COMMUNITY): Payer: Self-pay | Admitting: *Deleted

## 2016-08-27 DIAGNOSIS — O9912 Other diseases of the blood and blood-forming organs and certain disorders involving the immune mechanism complicating childbirth: Secondary | ICD-10-CM

## 2016-08-27 DIAGNOSIS — O24429 Gestational diabetes mellitus in childbirth, unspecified control: Secondary | ICD-10-CM

## 2016-08-27 DIAGNOSIS — Z3A40 40 weeks gestation of pregnancy: Secondary | ICD-10-CM

## 2016-08-27 DIAGNOSIS — D6959 Other secondary thrombocytopenia: Secondary | ICD-10-CM

## 2016-08-27 LAB — CBC
HCT: 33.3 % — ABNORMAL LOW (ref 36.0–46.0)
HEMATOCRIT: 30.4 % — AB (ref 36.0–46.0)
HEMOGLOBIN: 10.3 g/dL — AB (ref 12.0–15.0)
Hemoglobin: 11.6 g/dL — ABNORMAL LOW (ref 12.0–15.0)
MCH: 30.8 pg (ref 26.0–34.0)
MCH: 30.3 pg (ref 26.0–34.0)
MCHC: 34.8 g/dL (ref 30.0–36.0)
MCHC: 33.9 g/dL (ref 30.0–36.0)
MCV: 88.3 fL (ref 78.0–100.0)
MCV: 89.4 fL (ref 78.0–100.0)
PLATELETS: 113 10*3/uL — AB (ref 150–400)
Platelets: 101 10*3/uL — ABNORMAL LOW (ref 150–400)
RBC: 3.77 MIL/uL — ABNORMAL LOW (ref 3.87–5.11)
RBC: 3.4 MIL/uL — AB (ref 3.87–5.11)
RDW: 15.2 % (ref 11.5–15.5)
RDW: 15 % (ref 11.5–15.5)
WBC: 11.1 10*3/uL — AB (ref 4.0–10.5)
WBC: 12.5 10*3/uL — ABNORMAL HIGH (ref 4.0–10.5)

## 2016-08-27 SURGERY — Surgical Case
Anesthesia: Epidural | Site: Abdomen | Wound class: Clean Contaminated

## 2016-08-27 MED ORDER — EPHEDRINE 5 MG/ML INJ: 10.0000 mg | INTRAVENOUS | Status: DC | PRN

## 2016-08-27 MED ORDER — COCONUT OIL OIL
1.0000 "application " | TOPICAL_OIL | Status: DC | PRN
  Administered 2016-08-30: 1 via TOPICAL
  Filled 2016-08-27: qty 120

## 2016-08-27 MED ORDER — FENTANYL 2.5 MCG/ML BUPIVACAINE 1/10 % EPIDURAL INFUSION (WH - ANES)
14.0000 mL/h | INTRAMUSCULAR | Status: DC | PRN
  Administered 2016-08-27: 12 mL/h via EPIDURAL

## 2016-08-27 MED ORDER — OXYCODONE-ACETAMINOPHEN 5-325 MG PO TABS
2.0000 | ORAL_TABLET | ORAL | Status: DC | PRN
  Administered 2016-08-28 – 2016-08-29 (×2): 2 via ORAL
  Filled 2016-08-27 (×2): qty 2

## 2016-08-27 MED ORDER — SCOPOLAMINE 1 MG/3DAYS TD PT72: 1.0000 | MEDICATED_PATCH | Freq: Once | TRANSDERMAL | Status: DC

## 2016-08-27 MED ORDER — MORPHINE SULFATE (PF) 0.5 MG/ML IJ SOLN
INTRAMUSCULAR | Status: AC
  Filled 2016-08-27: qty 10

## 2016-08-27 MED ORDER — OXYTOCIN 40 UNITS IN LACTATED RINGERS INFUSION - SIMPLE MED: 2.5000 [IU]/h | INTRAVENOUS | Status: AC

## 2016-08-27 MED ORDER — METHYLERGONOVINE MALEATE 0.2 MG/ML IJ SOLN
INTRAMUSCULAR | Status: DC | PRN
  Administered 2016-08-27: 0.2 mg via INTRAMUSCULAR

## 2016-08-27 MED ORDER — OXYTOCIN 10 UNIT/ML IJ SOLN
INTRAMUSCULAR | Status: AC
  Filled 2016-08-27: qty 4

## 2016-08-27 MED ORDER — DIPHENHYDRAMINE HCL 50 MG/ML IJ SOLN
12.5000 mg | INTRAMUSCULAR | Status: DC | PRN
Start: 2016-08-27 — End: 2016-08-30

## 2016-08-27 MED ORDER — NALBUPHINE HCL 10 MG/ML IJ SOLN: 5.0000 mg | Freq: Once | INTRAMUSCULAR | Status: DC | PRN

## 2016-08-27 MED ORDER — IBUPROFEN 600 MG PO TABS: 600.0000 mg | ORAL_TABLET | Freq: Four times a day (QID) | ORAL | Status: DC | PRN

## 2016-08-27 MED ORDER — SODIUM CHLORIDE 0.9% FLUSH: 3.0000 mL | INTRAVENOUS | Status: DC | PRN

## 2016-08-27 MED ORDER — SODIUM CHLORIDE 0.9 % IR SOLN
Status: DC | PRN
  Administered 2016-08-27: 1000 mL

## 2016-08-27 MED ORDER — SODIUM BICARBONATE 8.4 % IV SOLN
INTRAVENOUS | Status: DC | PRN
  Administered 2016-08-27 (×2): 5 mL via EPIDURAL

## 2016-08-27 MED ORDER — NALOXONE HCL 2 MG/2ML IJ SOSY: 1.0000 ug/kg/h | PREFILLED_SYRINGE | INTRAVENOUS | Status: DC | PRN

## 2016-08-27 MED ORDER — METHYLERGONOVINE MALEATE 0.2 MG/ML IJ SOLN
INTRAMUSCULAR | Status: AC
  Filled 2016-08-27: qty 1

## 2016-08-27 MED ORDER — PROMETHAZINE HCL 25 MG/ML IJ SOLN: 25.0000 mg | Freq: Four times a day (QID) | INTRAMUSCULAR | Status: DC | PRN

## 2016-08-27 MED ORDER — CEFAZOLIN SODIUM-DEXTROSE 2-3 GM-% IV SOLR
INTRAVENOUS | Status: DC | PRN
  Administered 2016-08-27: 2 g via INTRAVENOUS

## 2016-08-27 MED ORDER — NALOXONE HCL 0.4 MG/ML IJ SOLN: 0.4000 mg | INTRAMUSCULAR | Status: DC | PRN

## 2016-08-27 MED ORDER — ONDANSETRON HCL 4 MG/2ML IJ SOLN
INTRAMUSCULAR | Status: DC | PRN
  Administered 2016-08-27: 4 mg via INTRAVENOUS

## 2016-08-27 MED ORDER — PHENYLEPHRINE 40 MCG/ML (10ML) SYRINGE FOR IV PUSH (FOR BLOOD PRESSURE SUPPORT): 80.0000 ug | PREFILLED_SYRINGE | INTRAVENOUS | Status: DC | PRN

## 2016-08-27 MED ORDER — MORPHINE SULFATE (PF) 0.5 MG/ML IJ SOLN
INTRAMUSCULAR | Status: DC | PRN
  Administered 2016-08-27: 1 mg via INTRAVENOUS
  Administered 2016-08-27: 4 mg via EPIDURAL

## 2016-08-27 MED ORDER — LIDOCAINE HCL (PF) 1 % IJ SOLN
INTRAMUSCULAR | Status: DC | PRN
  Administered 2016-08-27 (×2): 5 mL via EPIDURAL

## 2016-08-27 MED ORDER — LACTATED RINGERS IV SOLN
INTRAVENOUS | Status: DC | PRN
  Administered 2016-08-27 (×2): via INTRAVENOUS

## 2016-08-27 MED ORDER — DIPHENHYDRAMINE HCL 25 MG PO CAPS: 25.0000 mg | ORAL_CAPSULE | Freq: Four times a day (QID) | ORAL | Status: DC | PRN

## 2016-08-27 MED ORDER — SIMETHICONE 80 MG PO CHEW
80.0000 mg | CHEWABLE_TABLET | ORAL | Status: DC
  Administered 2016-08-28 – 2016-08-30 (×3): 80 mg via ORAL
  Filled 2016-08-27 (×3): qty 1

## 2016-08-27 MED ORDER — FENTANYL 2.5 MCG/ML BUPIVACAINE 1/10 % EPIDURAL INFUSION (WH - ANES)
INTRAMUSCULAR | Status: AC
  Filled 2016-08-27: qty 125

## 2016-08-27 MED ORDER — MEPERIDINE HCL 25 MG/ML IJ SOLN: 6.2500 mg | INTRAMUSCULAR | Status: DC | PRN

## 2016-08-27 MED ORDER — SCOPOLAMINE 1 MG/3DAYS TD PT72
MEDICATED_PATCH | TRANSDERMAL | Status: AC
  Filled 2016-08-27: qty 1

## 2016-08-27 MED ORDER — KETOROLAC TROMETHAMINE 30 MG/ML IJ SOLN: 30.0000 mg | Freq: Once | INTRAMUSCULAR | Status: DC

## 2016-08-27 MED ORDER — MAGNESIUM HYDROXIDE 400 MG/5ML PO SUSP: 30.0000 mL | ORAL | Status: DC | PRN

## 2016-08-27 MED ORDER — NALBUPHINE HCL 10 MG/ML IJ SOLN: 5.0000 mg | INTRAMUSCULAR | Status: DC | PRN

## 2016-08-27 MED ORDER — HYDROMORPHONE HCL 1 MG/ML IJ SOLN: 0.2500 mg | INTRAMUSCULAR | Status: DC | PRN

## 2016-08-27 MED ORDER — OXYCODONE-ACETAMINOPHEN 5-325 MG PO TABS
1.0000 | ORAL_TABLET | ORAL | Status: DC | PRN
  Administered 2016-08-28 – 2016-08-30 (×6): 1 via ORAL
  Filled 2016-08-27 (×6): qty 1

## 2016-08-27 MED ORDER — WITCH HAZEL-GLYCERIN EX PADS: 1.0000 "application " | MEDICATED_PAD | CUTANEOUS | Status: DC | PRN

## 2016-08-27 MED ORDER — ACETAMINOPHEN 500 MG PO TABS
1000.0000 mg | ORAL_TABLET | Freq: Four times a day (QID) | ORAL | Status: AC
  Administered 2016-08-28 (×2): 1000 mg via ORAL
  Filled 2016-08-27 (×2): qty 2

## 2016-08-27 MED ORDER — SCOPOLAMINE 1 MG/3DAYS TD PT72
MEDICATED_PATCH | TRANSDERMAL | Status: DC | PRN
  Administered 2016-08-27: 1 via TRANSDERMAL

## 2016-08-27 MED ORDER — CEFAZOLIN SODIUM-DEXTROSE 2-4 GM/100ML-% IV SOLN
INTRAVENOUS | Status: AC
  Filled 2016-08-27: qty 100

## 2016-08-27 MED ORDER — ONDANSETRON HCL 4 MG/2ML IJ SOLN
INTRAMUSCULAR | Status: AC
  Filled 2016-08-27: qty 2

## 2016-08-27 MED ORDER — ZOLPIDEM TARTRATE 5 MG PO TABS
5.0000 mg | ORAL_TABLET | Freq: Every evening | ORAL | Status: DC | PRN
Start: 2016-08-27 — End: 2016-08-30

## 2016-08-27 MED ORDER — PHENYLEPHRINE 40 MCG/ML (10ML) SYRINGE FOR IV PUSH (FOR BLOOD PRESSURE SUPPORT)
PREFILLED_SYRINGE | INTRAVENOUS | Status: AC
  Filled 2016-08-27: qty 20

## 2016-08-27 MED ORDER — DIPHENHYDRAMINE HCL 25 MG PO CAPS: 25.0000 mg | ORAL_CAPSULE | ORAL | Status: DC | PRN

## 2016-08-27 MED ORDER — LACTATED RINGERS IV SOLN
500.0000 mL | Freq: Once | INTRAVENOUS | Status: AC
  Administered 2016-08-27: 125 mL/h via INTRAVENOUS

## 2016-08-27 MED ORDER — BUPIVACAINE HCL (PF) 0.5 % IJ SOLN
INTRAMUSCULAR | Status: DC | PRN
  Administered 2016-08-27: 30 mL

## 2016-08-27 MED ORDER — MEASLES, MUMPS & RUBELLA VAC ~~LOC~~ INJ: 0.5000 mL | INJECTION | Freq: Once | SUBCUTANEOUS | Status: DC

## 2016-08-27 MED ORDER — ACETAMINOPHEN 325 MG PO TABS
650.0000 mg | ORAL_TABLET | ORAL | Status: DC | PRN
Start: 2016-08-27 — End: 2016-08-30

## 2016-08-27 MED ORDER — SENNOSIDES-DOCUSATE SODIUM 8.6-50 MG PO TABS
2.0000 | ORAL_TABLET | ORAL | Status: DC
  Administered 2016-08-28 – 2016-08-30 (×3): 2 via ORAL
  Filled 2016-08-27 (×3): qty 2

## 2016-08-27 MED ORDER — LACTATED RINGERS IV SOLN
INTRAVENOUS | Status: DC
  Administered 2016-08-27: 125 mL/h via INTRAVENOUS
  Administered 2016-08-28 (×2): via INTRAVENOUS

## 2016-08-27 MED ORDER — BUPIVACAINE HCL (PF) 0.5 % IJ SOLN
INTRAMUSCULAR | Status: AC
  Filled 2016-08-27: qty 30

## 2016-08-27 MED ORDER — FENTANYL CITRATE (PF) 100 MCG/2ML IJ SOLN
100.0000 ug | INTRAMUSCULAR | Status: DC | PRN
  Administered 2016-08-27 (×2): 100 ug via INTRAVENOUS
  Filled 2016-08-27: qty 2

## 2016-08-27 MED ORDER — KETOROLAC TROMETHAMINE 30 MG/ML IJ SOLN
INTRAMUSCULAR | Status: AC
  Filled 2016-08-27: qty 1

## 2016-08-27 MED ORDER — KETOROLAC TROMETHAMINE 30 MG/ML IJ SOLN
30.0000 mg | Freq: Four times a day (QID) | INTRAMUSCULAR | Status: DC | PRN
  Administered 2016-08-27: 30 mg via INTRAMUSCULAR

## 2016-08-27 MED ORDER — LACTATED RINGERS IV SOLN
INTRAVENOUS | Status: DC | PRN
  Administered 2016-08-27: 16:00:00 via INTRAVENOUS

## 2016-08-27 MED ORDER — BUTORPHANOL TARTRATE 2 MG/ML IJ SOLN
2.0000 mg | INTRAMUSCULAR | Status: DC | PRN
  Administered 2016-08-27: 2 mg via INTRAVENOUS
  Filled 2016-08-27: qty 2

## 2016-08-27 MED ORDER — DIPHENHYDRAMINE HCL 50 MG/ML IJ SOLN: 12.5000 mg | INTRAMUSCULAR | Status: DC | PRN

## 2016-08-27 MED ORDER — OXYTOCIN 10 UNIT/ML IJ SOLN
INTRAVENOUS | Status: DC | PRN
  Administered 2016-08-27: 40 [IU] via INTRAVENOUS

## 2016-08-27 MED ORDER — KETOROLAC TROMETHAMINE 30 MG/ML IJ SOLN: 30.0000 mg | Freq: Four times a day (QID) | INTRAMUSCULAR | Status: DC | PRN

## 2016-08-27 MED ORDER — ONDANSETRON HCL 4 MG/2ML IJ SOLN: 4.0000 mg | Freq: Three times a day (TID) | INTRAMUSCULAR | Status: DC | PRN

## 2016-08-27 MED ORDER — DIBUCAINE 1 % RE OINT: 1.0000 "application " | TOPICAL_OINTMENT | RECTAL | Status: DC | PRN

## 2016-08-27 MED ORDER — FENTANYL CITRATE (PF) 100 MCG/2ML IJ SOLN
INTRAMUSCULAR | Status: AC
  Administered 2016-08-27: 100 ug via INTRAVENOUS
  Filled 2016-08-27: qty 2

## 2016-08-27 MED ORDER — LEVOTHYROXINE SODIUM 50 MCG PO TABS
50.0000 ug | ORAL_TABLET | Freq: Every day | ORAL | Status: DC
  Administered 2016-08-28 – 2016-08-30 (×3): 50 ug via ORAL
  Filled 2016-08-27 (×3): qty 1

## 2016-08-27 MED ORDER — IBUPROFEN 600 MG PO TABS
600.0000 mg | ORAL_TABLET | Freq: Four times a day (QID) | ORAL | Status: DC
  Administered 2016-08-28 – 2016-08-30 (×6): 600 mg via ORAL
  Filled 2016-08-27 (×7): qty 1

## 2016-08-27 MED ORDER — SIMETHICONE 80 MG PO CHEW: 80.0000 mg | CHEWABLE_TABLET | ORAL | Status: DC | PRN

## 2016-08-27 MED ORDER — PRENATAL MULTIVITAMIN CH
1.0000 | ORAL_TABLET | Freq: Every day | ORAL | Status: DC
  Administered 2016-08-28 – 2016-08-29 (×2): 1 via ORAL
  Filled 2016-08-27 (×2): qty 1

## 2016-08-27 MED ORDER — FERROUS SULFATE 325 (65 FE) MG PO TABS
325.0000 mg | ORAL_TABLET | Freq: Two times a day (BID) | ORAL | Status: DC
  Administered 2016-08-28 – 2016-08-30 (×5): 325 mg via ORAL
  Filled 2016-08-27 (×6): qty 1

## 2016-08-27 MED ORDER — MENTHOL 3 MG MT LOZG
1.0000 | LOZENGE | OROMUCOSAL | Status: DC | PRN
Start: 2016-08-27 — End: 2016-08-30

## 2016-08-27 MED ORDER — LACTATED RINGERS IV SOLN
500.0000 mL | Freq: Once | INTRAVENOUS | Status: AC
  Administered 2016-08-27: 500 mL via INTRAVENOUS

## 2016-08-27 MED ORDER — TETANUS-DIPHTH-ACELL PERTUSSIS 5-2.5-18.5 LF-MCG/0.5 IM SUSP: 0.5000 mL | Freq: Once | INTRAMUSCULAR | Status: DC

## 2016-08-27 MED ORDER — OXYTOCIN 40 UNITS IN LACTATED RINGERS INFUSION - SIMPLE MED
1.0000 m[IU]/min | INTRAVENOUS | Status: DC
  Administered 2016-08-27: 2 m[IU]/min via INTRAVENOUS
  Filled 2016-08-27: qty 1000

## 2016-08-27 MED ORDER — PROMETHAZINE HCL 25 MG/ML IJ SOLN: 12.5000 mg | Freq: Once | INTRAMUSCULAR | Status: DC | PRN

## 2016-08-27 SURGICAL SUPPLY — 31 items
BENZOIN TINCTURE PRP APPL 2/3 (GAUZE/BANDAGES/DRESSINGS) ×3 IMPLANT
CHLORAPREP W/TINT 26ML (MISCELLANEOUS) ×3 IMPLANT
CLAMP CORD UMBIL (MISCELLANEOUS) ×3 IMPLANT
CLOSURE WOUND 1/2 X4 (GAUZE/BANDAGES/DRESSINGS) ×1
CLOTH BEACON ORANGE TIMEOUT ST (SAFETY) ×3 IMPLANT
DRSG OPSITE POSTOP 4X10 (GAUZE/BANDAGES/DRESSINGS) ×3 IMPLANT
ELECT REM PT RETURN 9FT ADLT (ELECTROSURGICAL) ×3
ELECTRODE REM PT RTRN 9FT ADLT (ELECTROSURGICAL) ×1 IMPLANT
GLOVE BIOGEL PI IND STRL 7.0 (GLOVE) ×3 IMPLANT
GLOVE BIOGEL PI INDICATOR 7.0 (GLOVE) ×6
GLOVE ECLIPSE 7.0 STRL STRAW (GLOVE) ×3 IMPLANT
GOWN STRL REUS W/TWL LRG LVL3 (GOWN DISPOSABLE) ×6 IMPLANT
NEEDLE HYPO 22GX1.5 SAFETY (NEEDLE) ×3 IMPLANT
NEEDLE HYPO 25X5/8 SAFETYGLIDE (NEEDLE) ×3 IMPLANT
NS IRRIG 1000ML POUR BTL (IV SOLUTION) ×3 IMPLANT
PACK C SECTION WH (CUSTOM PROCEDURE TRAY) ×3 IMPLANT
PAD ABD 7.5X8 STRL (GAUZE/BANDAGES/DRESSINGS) ×3 IMPLANT
PAD OB MATERNITY 4.3X12.25 (PERSONAL CARE ITEMS) ×3 IMPLANT
RTRCTR C-SECT PINK 25CM LRG (MISCELLANEOUS) ×3 IMPLANT
SPONGE GAUZE 4X4 12PLY (GAUZE/BANDAGES/DRESSINGS) ×3 IMPLANT
STRIP CLOSURE SKIN 1/2X4 (GAUZE/BANDAGES/DRESSINGS) ×2 IMPLANT
SUT PDS AB 0 CTX 36 PDP370T (SUTURE) ×3 IMPLANT
SUT PLAIN 2 0 XLH (SUTURE) IMPLANT
SUT VIC AB 0 CTX 36 (SUTURE) ×6
SUT VIC AB 0 CTX36XBRD ANBCTRL (SUTURE) ×3 IMPLANT
SUT VIC AB 2-0 CT1 27 (SUTURE) ×2
SUT VIC AB 2-0 CT1 TAPERPNT 27 (SUTURE) ×1 IMPLANT
SUT VIC AB 4-0 KS 27 (SUTURE) ×3 IMPLANT
SYR CONTROL 10ML LL (SYRINGE) ×3 IMPLANT
TAPE CLOTH SURG 4X10 WHT LF (GAUZE/BANDAGES/DRESSINGS) ×3 IMPLANT
TOWEL OR 17X24 6PK STRL BLUE (TOWEL DISPOSABLE) ×3 IMPLANT

## 2016-08-27 NOTE — Anesthesia Preprocedure Evaluation (Addendum)
Anesthesia Evaluation  Patient identified by MRN, date of birth, ID band Patient awake    Reviewed: Allergy & Precautions, NPO status , Patient's Chart, lab work & pertinent test results  History of Anesthesia Complications Negative for: history of anesthetic complications  Airway Mallampati: III  TM Distance: >3 FB Neck ROM: Full    Dental  (+) Dental Advisory Given   Pulmonary neg pulmonary ROS,    breath sounds clear to auscultation       Cardiovascular negative cardio ROS   Rhythm:Regular Rate:Normal     Neuro/Psych negative neurological ROS     GI/Hepatic negative GI ROS, Neg liver ROS,   Endo/Other  diabetesHypothyroidism Morbid obesity  Renal/GU negative Renal ROS     Musculoskeletal   Abdominal (+) + obese,   Peds  Hematology plt 113k   Anesthesia Other Findings   Reproductive/Obstetrics (+) Pregnancy                             Anesthesia Physical Anesthesia Plan  ASA: II  Anesthesia Plan: Epidural   Post-op Pain Management:    Induction: Intravenous  Airway Management Planned: Natural Airway  Additional Equipment:   Intra-op Plan:   Post-operative Plan:   Informed Consent: I have reviewed the patients History and Physical, chart, labs and discussed the procedure including the risks, benefits and alternatives for the proposed anesthesia with the patient or authorized representative who has indicated his/her understanding and acceptance.     Plan Discussed with:   Anesthesia Plan Comments: (Patient identified. Risks/Benefits/Options discussed with patient including but not limited to bleeding, infection, nerve damage, paralysis, failed block, incomplete pain control, headache, blood pressure changes, nausea, vomiting, reactions to medication both or allergic, itching and postpartum back pain. Confirmed with bedside nurse the patient's most recent platelet count.  Confirmed with patient that they are not currently taking any anticoagulation, have any bleeding history or any family history of bleeding disorders. Patient expressed understanding and wished to proceed. All questions were answered.  Pt to have an epidural )       Anesthesia Quick Evaluation

## 2016-08-27 NOTE — Op Note (Signed)
Ashley Rhodes PROCEDURE DATE: 08/27/2016  PREOPERATIVE DIAGNOSES: Intrauterine pregnancy at [redacted]w[redacted]d weeks gestation; failure to progress: arrest of dilation, non-reassuring fetal status and failed attempt of vacuum-assisted vaginal delivery  POSTOPERATIVE DIAGNOSES: The same  PROCEDURE: Primary Low Transverse Cesarean Section  SURGEON:  Dr. Jaynie CollinsUgonna Ike Maragh  ASSISTANT:  Dr. Ernestina PennaNicholas Schenk  ANESTHESIOLOGIST: Dr. Leilani AbleFranklin Hatchett  INDICATIONS: Ashley PaciniRadhika Pokharel Rhodes is a 30 y.o. [id number] at 439w2d here for cesarean section secondary to the indications listed under preoperative diagnoses; please see preoperative note for further details.  The risks of cesarean section were discussed with the patient including but were not limited to: bleeding which may require transfusion or reoperation; infection which may require antibiotics; injury to bowel, bladder, ureters or other surrounding organs; injury to the fetus; need for additional procedures including hysterectomy in the event of a life-threatening hemorrhage; placental abnormalities wth subsequent pregnancies, incisional problems, thromboembolic phenomenon and other postoperative/anesthesia complications.   The patient concurred with the proposed plan, giving informed written consent for the procedure.    FINDINGS:  Viable female infant in cephalic presentation.  Apgars 8 and 9.  Meconium stained amniotic fluid.  Intact placenta, three vessel cord.  Normal uterus, fallopian tubes and ovaries bilaterally.  ANESTHESIA: Epidural INTRAVENOUS FLUIDS: 1600 ml ESTIMATED BLOOD LOSS: 500 ml URINE OUTPUT:  50 ml SPECIMENS: Placenta sent to L&D COMPLICATIONS: None immediate  PROCEDURE IN DETAIL:  The patient preoperatively received intravenous antibiotics and had sequential compression devices applied to her lower extremities.  She was then taken to the operating room where the epidural anesthesia was dosed up to surgical level and was found to be  adequate. She was then placed in a dorsal supine position with a leftward tilt, and prepped and draped in a sterile manner.  A foley catheter was placed into her bladder and attached to constant gravity.  After an adequate timeout was performed, a Pfannenstiel skin incision was made with scalpel and carried through to the underlying layer of fascia. The fascia was incised in the midline, and this incision was extended bilaterally using the Mayo scissors.  Kocher clamps were applied to the superior aspect of the fascial incision and the underlying rectus muscles were dissected off bluntly.  A similar process was carried out on the inferior aspect of the fascial incision. The rectus muscles were separated in the midline bluntly and the peritoneum was entered bluntly. Attention was turned to the lower uterine segment where a low transverse hysterotomy was made with a scalpel and extended bilaterally bluntly.  The infant was successfully delivered, the cord was clamped and cut after one minute, and the infant was handed over to the awaiting neonatology team. Uterine massage was then administered, and the placenta delivered intact with a three-vessel cord. The uterus was then cleared of clots and debris.  The hysterotomy was closed with 0 Vicryl in a running locked fashion, and an imbricating layer was also placed with 0 Vicryl.   The pelvis was cleared of all clot and debris. Hemostasis was confirmed on all surfaces.  The peritoneum was closed with a 0 Vicryl running stitch. The fascia was then closed using 0 Vicryl in a running fashion.  The subcutaneous layer was irrigated, and 30 ml of 0.5% Marcaine was injected subcutaneously around the incision.  The skin was closed with a 4-0 Vicryl subcuticular stitch. The patient tolerated the procedure well. Sponge, lap, instrument and needle counts were correct x 3.  She was taken to the recovery room in stable condition.  Jaynie CollinsUGONNA  Ashley Redner, MD, FACOG Attending  Obstetrician & Gynecologist Faculty Practice, [facility] [place]

## 2016-08-27 NOTE — Progress Notes (Signed)
Patient was seen and evaluated at the bedside. Contractions are occurring every 4-5 minutes with a category 1 tracing. Cervix is fingertip, posterior and thick. Cytotec has been placed. Fentanyl has been ordered. Will attempt to place foley bulb in 3 hours

## 2016-08-27 NOTE — Progress Notes (Signed)
Patient resting in bed. FHR is 135-145 with moderate variability. Pitocin is at 6; anticipate NSVD. Luna KitchensKathryn Kebra Lowrimore CNM

## 2016-08-27 NOTE — Transfer of Care (Signed)
Immediate Anesthesia Transfer of Care Note  Patient: Ashley Rhodes  Procedure(s) Performed: Procedure(s): CESAREAN SECTION (N/A)  Patient Location: PACU  Anesthesia Type:Epidural  Level of Consciousness: awake, alert  and oriented  Airway & Oxygen Therapy: Patient Spontanous Breathing  Post-op Assessment: Report given to RN and Post -op Vital signs reviewed and stable  Post vital signs: Reviewed and stable  Last Vitals:  Vitals:   08/27/16 1430 08/27/16 1622  BP: 128/72   Pulse: 94   Resp: 20   Temp:  37 C    Last Pain:  Vitals:   08/27/16 1622  TempSrc: Oral  PainSc: 0-No pain         Complications: No apparent anesthesia complications

## 2016-08-27 NOTE — Progress Notes (Signed)
LABOR PROGRESS NOTE  Ashley Rhodes is a 30 y.o. [id number] at 1972w2d  admitted for IOL  Subjective: Patient is comfortable in bed with no concerns at this time  Objective: BP 114/71   Pulse 74   Temp 98 F (36.7 C) (Oral)   Resp 18   Ht 4\' 11"  (1.499 m)   Wt 158 lb 9 oz (71.9 kg)   LMP 11/19/2015 (Exact Date)   BMI 32.03 kg/m  or  Vitals:   08/26/16 1530 08/26/16 1834 08/26/16 1933 08/26/16 2329  BP:  119/64 121/68 114/71  Pulse:  77 79 74  Resp: 18 18 18 18   Temp:  98.3 F (36.8 C) 98 F (36.7 C) 98 F (36.7 C)  TempSrc:  Oral Oral Oral  Weight:      Height:       FWB: Category 1 tracing Dilation: Closed Effacement (%): 60 Cervical Position: Posterior Station: -2 Presentation: Vertex Exam by:: Cletis MediaK Anderson, RN  Labs: Lab Results  Component Value Date   WBC 8.9 08/26/2016   HGB 12.5 08/26/2016   HCT 37.0 08/26/2016   MCV 90.5 08/26/2016   PLT 116 (L) 08/26/2016    Patient Active Problem List   Diagnosis Date Noted  . Gestational diabetes, diet controlled 08/26/2016  . Gestational thrombocytopenia ([facility]) 07/21/2016  . GDM (gestational diabetes mellitus) 07/21/2016  . 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 Hypothyroidism affecting pregnancy, antepartum 05/06/2016  . Supervision of high-risk pregnancy 05/06/2016    Assessment / Plan: 30 y.o. [id number] at 1972w2d here for IOL  Labor: Patient is S/P vaginal Cytotec @ 2247 with a plan to reevaluate in 4 hours Fetal Wellbeing:  Category 1 Anticipated MOD:  Vaginal  Ashley [name] D [name], MD 08/27/2016, 12:45 AM

## 2016-08-27 NOTE — Progress Notes (Signed)
After an hour of laboring down, fetal head still at plus 1 station. Maternal effort improved with rest but very little movement. Mother to labor down another hour as long as fetal heart rate tolerates  and then resume pushing. FHR shows variables with quick recovery and moderate variability between contractions.

## 2016-08-27 NOTE — Anesthesia Postprocedure Evaluation (Signed)
Anesthesia Post Note  Patient: Ashley Rhodes  Procedure(s) Performed: Procedure(s) (LRB): CESAREAN SECTION (N/A)  Patient location during evaluation: PACU Anesthesia Type: Epidural Level of consciousness: awake Pain management: pain level controlled Vital Signs Assessment: post-procedure vital signs reviewed and stable Respiratory status: spontaneous breathing Cardiovascular status: stable Postop Assessment: epidural receding Anesthetic complications: no     Last Vitals:  Vitals:   08/27/16 1715 08/27/16 1730  BP: 118/71 115/75  Pulse: 78 63  Resp: 18 (!) 23  Temp:      Last Pain:  Vitals:   08/27/16 1730  TempSrc:   PainSc: 0-No pain   Pain Goal:                 Ashley Rhodes,Ashley Rhodes

## 2016-08-27 NOTE — Progress Notes (Signed)
Patient with repetitive deep variables decelerations to 90s, still pushing.  Station is +2.  Counseled about needing help with vacuum vs cesarean section. Risks of both explained with the interpreter, she agreed to vacuum assistance.  Indication for operative vaginal delivery: Nonreassuring fetal heart rate tracing Risks of vacuum assistance were discussed in detail, including but not limited to, bleeding, infection, damage to maternal tissues, fetal cephalohematoma, inability to effect vaginal delivery of the head or shoulder dystocia that cannot be resolved by established maneuvers and need for emergency cesarean section.  Patient gave verbal consent. Patient was examined and found to be fully dilated with fetal station of +2. The soft vacuum soft cup was positioned over the sagittal suture 3 cm anterior to posterior fontanelle.  Pressure was then increased to 500 mmHg, and the patient was instructed to push.  Pulling was administered along the pelvic curve.  Two pull was administered during two pushes, no popoffs.  There was no descent noted at all. Decision was made to abort and proceed with cesarean section.  The risks of cesarean section discussed with the patient included but were not limited to: bleeding which may require transfusion or reoperation; infection which may require antibiotics; injury to bowel, bladder, ureters or other surrounding organs; injury to the fetus; need for additional procedures including hysterectomy in the event of a life-threatening hemorrhage; placental abnormalities wth subsequent pregnancies, incisional problems, thromboembolic phenomenon and other postoperative/anesthesia complications. The patient concurred with the proposed plan, giving informed written consent for the procedure.   Anesthesia and OR aware. Preoperative prophylactic antibiotics and SCDs ordered on call to the OR.  To OR when ready.   Jaynie CollinsUGONNA  Jyles Sontag, MD, FACOG Attending Obstetrician &  Gynecologist Faculty Practice, [facility] 

## 2016-08-27 NOTE — Progress Notes (Signed)
Called to room to evaluate for decel. Fetal heart rate has been in the 90's for 3 minutes. Checked patient, she is an anterior lip. Posit ition changes attempted with no improvement in HR. Will attempt pushing as pt is at +1 station. Patient pushed for about 1 hour. Good maternal effort but not effective pushing. Used interpretor with some improvement in quality of pushing. After 1 hour minimal progress made. Fetal HR improved. Will labor down for 1 hour.

## 2016-08-27 NOTE — Lactation Note (Signed)
This note was copied from a baby's chart. Lactation Consultation Note  Patient Name: Ashley Rhodes [name] BJYNW'GToday's Date: 08/27/2016 Reason for consult: Initial assessment Baby at 2 hr of life. Lactation called to PACU because baby was cueing but could not maintain latch. Mom has firm, non compressible breast with short shaft nipples that have an inverted center. If lactation or FOB supports the breast baby can maintain latch for short bursts of sucking. Applied #20 NS. Baby was able to latch with minimal effort and maintain log bursts of sucking without support. Mom was very sleepy. Discussed instruction for NS use and care with FOB. Instructed FOB to call for lactation if mom is having trouble once she is moved to her room. Parents need basic bf education and lactation handouts.    Maternal Data Has patient been taught Hand Expression?: Yes Does the patient have breastfeeding experience prior to this delivery?: No  Feeding Feeding Type: Breast Fed Length of feed: 10 min  LATCH Score/Interventions Latch: Repeated attempts needed to sustain latch, nipple held in mouth throughout feeding, stimulation needed to elicit sucking reflex. Intervention(s): Adjust position;Assist with latch;Breast massage;Breast compression  Audible Swallowing: None Intervention(s): Hand expression;Skin to skin  Type of Nipple: Everted at rest and after stimulation  Comfort (Breast/Nipple): Soft / non-tender (very short shaft and inverted center )     Hold (Positioning): Full assist, staff holds infant at breast Intervention(s): Support Pillows;Position options  LATCH Score: 5  Lactation Tools Discussed/Used Tools: Nipple Shields Nipple shield size: 20   Consult Status Consult Status: Follow-up Date: 08/28/16 Follow-up type: In-patient    Rulon Eisenmengerlizabeth E Collin Rengel 08/27/2016, 6:43 PM

## 2016-08-27 NOTE — Anesthesia Procedure Notes (Addendum)
Epidural Patient location during procedure: OB Start time: 08/27/2016 7:25 AM End time: 08/27/2016 7:44 AM  Staffing Anesthesiologist: Jairo BenJACKSON, Zhyon Antenucci Performed: anesthesiologist   Preanesthetic Checklist Completed: patient identified, surgical consent, pre-op evaluation, timeout performed, IV checked, risks and benefits discussed and monitors and equipment checked  Epidural Patient position: sitting Prep: site prepped and draped and DuraPrep Patient monitoring: blood pressure, continuous pulse ox and heart rate Approach: midline Location: L2-L3 Injection technique: LOR air  Needle:  Needle type: Tuohy  Needle gauge: 17 G Needle length: 9 cm Needle insertion depth: 5 cm Catheter type: closed end flexible Catheter size: 19 Gauge Catheter at skin depth: 10 cm Test dose: negative (1% lidocaine)  Assessment Events: blood not aspirated, injection not painful, no injection resistance, negative IV test and no paresthesia  Additional Notes Pt identified in Labor room.  Monitors applied. Working IV access confirmed. Sterile prep, drape lumbar spine.  1% lido local L 2,3.  #17ga Touhy LOR air at 5 cm L 2,3, cath in easily to 10 cm skin. Test dose OK, cath dosed and infusion begun.  Patient asymptomatic, VSS, no heme aspirated, tolerated well.  Ashley Rhodes, MDReason for block:procedure for pain

## 2016-08-28 LAB — CBC
HEMATOCRIT: 23.9 % — AB (ref 36.0–46.0)
HEMOGLOBIN: 8.2 g/dL — AB (ref 12.0–15.0)
MCH: 30.7 pg (ref 26.0–34.0)
MCHC: 34.3 g/dL (ref 30.0–36.0)
MCV: 89.5 fL (ref 78.0–100.0)
Platelets: 86 10*3/uL — ABNORMAL LOW (ref 150–400)
RBC: 2.67 MIL/uL — AB (ref 3.87–5.11)
RDW: 15.4 % (ref 11.5–15.5)
WBC: 10.5 10*3/uL (ref 4.0–10.5)

## 2016-08-28 LAB — GLUCOSE, CAPILLARY: GLUCOSE-CAPILLARY: 82 mg/dL (ref 65–99)

## 2016-08-28 MED ORDER — LACTATED RINGERS IV BOLUS (SEPSIS)
500.0000 mL | Freq: Once | INTRAVENOUS | Status: AC
  Administered 2016-08-28: 500 mL via INTRAVENOUS

## 2016-08-28 NOTE — Anesthesia Postprocedure Evaluation (Signed)
Anesthesia Post Note  Patient: Ashley Rhodes  Procedure(s) Performed: Procedure(s) (LRB): CESAREAN SECTION (N/A)  Patient location during evaluation: Mother Baby Anesthesia Type: Epidural Level of consciousness: awake, awake and alert, oriented and patient cooperative Pain management: pain level controlled Vital Signs Assessment: post-procedure vital signs reviewed and stable Respiratory status: spontaneous breathing, nonlabored ventilation and respiratory function stable Cardiovascular status: stable Postop Assessment: patient able to bend at knees, no headache, no backache and no signs of nausea or vomiting Anesthetic complications: no     Last Vitals:  Vitals:   08/28/16 0200 08/28/16 0610  BP: (!) 94/52 (!) 100/55  Pulse: (!) 113 96  Resp: 18 18  Temp: 37 C 36.7 C    Last Pain:  Vitals:   08/28/16 0610  TempSrc: Oral  PainSc: 2    Pain Goal:                 [name] L

## 2016-08-28 NOTE — Lactation Note (Signed)
This note was copied from a baby's chart. Lactation Consultation Note  Baby sleeping.  Suggest unwrapping him for feeding. Reviewed hand expression.  Drops expressed and given to baby on spoon. Prepumped with hand pump and attempted latching in football, cross cradle and with #20NS and without NS. No sucking. Recommend mother place baby STS until he cues and retry. Left nipple more evert. Suggest to RN if baby continues to not latch, to set up DEBP this evening and give volume pumped to baby. Discussed this with mother also. Recommend mother call if she needs further assistance.  Patient Name: Ashley Felix [name] ZOXWR'UToday's Date: 08/28/2016 Reason for consult: Follow-up assessment   Maternal Data    Feeding Feeding Type: Breast Fed Length of feed: 5 min  LATCH Score/Interventions Latch: Repeated attempts needed to sustain latch, nipple held in mouth throughout feeding, stimulation needed to elicit sucking reflex. Intervention(s): Adjust position;Assist with latch;Breast massage  Audible Swallowing: A few with stimulation Intervention(s): Skin to skin Intervention(s): Hand expression  Type of Nipple: Flat Intervention(s): Reverse pressure  Comfort (Breast/Nipple): Soft / non-tender     Hold (Positioning): Assistance needed to correctly position infant at breast and maintain latch.  LATCH Score: 6  Lactation Tools Discussed/Used     Consult Status Consult Status: Follow-up Date: 08/29/16 Follow-up type: In-patient    Dahlia ByesBerkelhammer, Williom [facility] 08/28/2016, 3:46 PM

## 2016-08-28 NOTE — Progress Notes (Signed)
Spoke with Dr.Santos regarding change in pt vitals (BP 94/52 and pulse 113) and low output (200 ambar urine). Bolus for LR received.

## 2016-08-28 NOTE — Progress Notes (Signed)
POSTPARTUM PROGRESS NOTE  Post Op Day 01 Subjective:  Ashley Rhodes is a 30 y.o. [id number] 3665w2d s/p PLTCS for FTP.  Patient experienced an episode of tachycardia which resolved S/P 500 cc of LR.  Pt reports lower abdominal discomfort with ambulating, she is voiding and tolerating po intake.  She denies nausea or vomiting.  Pain is well controlled.  She has had flatus. She has not had bowel movement.  Lochia Small.   Objective: Blood pressure (!) 100/55, pulse 96, temperature 98.1 F (36.7 C), temperature source Oral, resp. rate 18, height 4\' 11"  (1.499 m), weight 158 lb 9 oz (71.9 kg), last menstrual period 11/19/2015, SpO2 95 %, unknown if currently breastfeeding.  Physical Exam:  General: alert, cooperative and no distress Lochia:normal flow Heart: RRR with intact distal pulses Abdomen: soft, with lower abdominal tenderness, dressing is CDI  Uterine Fundus: firm and below the level of the umbilicus DVT Evaluation: No calf swelling or tenderness  Recent Labs  08/27/16 1630 08/28/16 0635  HGB 10.3* 8.2*  HCT 30.4* 23.9*    Assessment/Plan:  ASSESSMENT: Ashley Rhodes is a 30 y.o. [id number] 7865w2d s/p PLTCS for FTP  Breastfeeding, Lactation consult and Contraception Condoms  Patients do not want a circumcision   LOS: 2 days   [name] [name] D Santos, MD 08/28/2016, 7:40 AM   CNM attestation Post Partum Day #1 I have seen and examined this patient and agree with above documentation in the resident's note.   Ashley Rhodes is a 30 y.o. [id number] s/p pLTCS.  Pt denies problems with ambulating, voiding or po intake. Pain is well controlled.  Plan for birth control is condoms.  Method of Feeding: breast  PE:  BP (!) 100/55 (BP Location: Right Arm)   Pulse 96   Temp 98.2 F (36.8 C) (Oral)   Resp 20   Ht 4\' 11"  (1.499 m)   Wt 71.9 kg (158 lb 9 oz)   LMP 11/19/2015 (Exact Date)   SpO2 94%   Breastfeeding? Unknown   BMI 32.03 kg/m  Fundus firm  Plan for  discharge: 08/29/16  Cam HaiSHAW, Peyton Rossner, CNM 2:49 PM  08/28/2016

## 2016-08-28 NOTE — Addendum Note (Signed)
Addendum  created 08/28/16 0848 by Yolonda KidaAlison L Kristi Norment, CRNA   Sign clinical note

## 2016-08-29 LAB — BIRTH TISSUE RECOVERY COLLECTION (PLACENTA DONATION)

## 2016-08-29 LAB — CBC
HEMATOCRIT: 25.1 % — AB (ref 36.0–46.0)
Hemoglobin: 8.4 g/dL — ABNORMAL LOW (ref 12.0–15.0)
MCH: 30.5 pg (ref 26.0–34.0)
MCHC: 33.5 g/dL (ref 30.0–36.0)
MCV: 91.3 fL (ref 78.0–100.0)
PLATELETS: 118 10*3/uL — AB (ref 150–400)
RBC: 2.75 MIL/uL — ABNORMAL LOW (ref 3.87–5.11)
RDW: 15.8 % — AB (ref 11.5–15.5)
WBC: 11.6 10*3/uL — AB (ref 4.0–10.5)

## 2016-08-29 MED ORDER — IBUPROFEN 600 MG PO TABS: 600.0000 mg | ORAL_TABLET | Freq: Four times a day (QID) | ORAL | 0 refills | Status: DC

## 2016-08-29 MED ORDER — DOCUSATE SODIUM 100 MG PO CAPS: 100.0000 mg | ORAL_CAPSULE | Freq: Two times a day (BID) | ORAL | 0 refills | Status: DC

## 2016-08-29 MED ORDER — OXYCODONE-ACETAMINOPHEN 5-325 MG PO TABS: 1.0000 | ORAL_TABLET | ORAL | 0 refills | Status: DC | PRN

## 2016-08-29 NOTE — Progress Notes (Signed)
Patient ID: Ashley PaciniRadhika Rhodes Rhodes, female   DOB: 10/13/1986, 30 y.o.   MRN: [id number]  POSTPARTUM PROGRESS NOTE  Post Op Day #2 Subjective:  Ashley Rhodes is a 30 y.o. [id number] 1962w2d s/p PLTCS 2/2 failure to progress after failed IOL for GDMA1.  No acute events overnight.  Pt denies problems with ambulating, voiding or po intake.  She denies nausea or vomiting.  Pain is moderately controlled.  She has had flatus. She has not had bowel movement.  Lochia Small.   Objective: Blood pressure 130/70, pulse (!) 106, temperature 97.7 F (36.5 C), temperature source Oral, resp. rate 18, height 4\' 11"  (1.499 m), weight 158 lb 9 oz (71.9 kg), last menstrual period 11/19/2015, SpO2 97 %, unknown if currently breastfeeding.  Physical Exam:  General: alert, cooperative and no distress Lochia:normal flow Chest: CTAB Heart: RRR no m/r/g Abdomen: +BS, soft, nontender, non-distended. Incision is clean, dry, intact with 50% honeycomb old blood soaked on lower half, no new bleeding noted.  Uterine Fundus: firm, below umbilicus.  DVT Evaluation: No calf swelling or tenderness Extremities: Trace edema   Recent Labs  08/27/16 1630 08/28/16 0635  HGB 10.3* 8.2*  HCT 30.4* 23.9*    Assessment/Plan:  ASSESSMENT: Ashley Rhodes is a 30 y.o. [id number] 7362w2d s/p PLTCS 2/2 failure to progress, failed IOL.   Plan for discharge tomorrow, Breastfeeding, Lactation consult and Contraception Condoms  Baby is not ready for discharge today.   LOS: 3 days   Cleda ClarksElizabeth W Mumaw, DO OB Fellow [facility]  08/29/2016, 7:56 AM

## 2016-08-29 NOTE — Lactation Note (Addendum)
This note was copied from a baby's chart. Lactation Consultation Note  Attempted latching in cross cradle, baby mouths nipple and does not suck. Applied #20NS and baby sustained latch for 15 min.  Demonstrated how to burp baby. Prefilled NS with approx 3 ml of formula and re-latched baby. Encouraged parents to try without NS first.  If baby does not sustain latch, apply NS. BF on demand. Post pump 4-5 times a day for 10-15 min and give volume back to baby at next feeding. Supplement after feedings with formula. Parents agreeable with plan.  Patient Name: Ashley Rhodes Ashley Rhodes [id number]'JToday's Date: 08/29/2016 Reason for consult: Follow-up assessment   Maternal Data    Feeding Feeding Type: Breast Fed  LATCH Score/Interventions Latch: Repeated attempts needed to sustain latch, nipple held in mouth throughout feeding, stimulation needed to elicit sucking reflex.  Audible Swallowing: A few with stimulation  Type of Nipple: Flat  Comfort (Breast/Nipple): Soft / non-tender     Hold (Positioning): Assistance needed to correctly position infant at breast and maintain latch.  LATCH Score: 6  Lactation Tools Discussed/Used Tools: Nipple Shields Nipple shield size: 20   Consult Status Consult Status: Follow-up Date: 08/30/16 Follow-up type: In-patient    Dahlia ByesBerkelhammer, Edna [facility] 08/29/2016, 3:52 PM

## 2016-08-30 NOTE — Discharge Summary (Signed)
OB Discharge Summary     Patient Name: Ashley Rhodes DOB: [birth year] MRN: [id number]  Date of admission: 08/26/2016 Delivering MD: Verita Schneiders A   Date of discharge: 08/30/2016  Admitting diagnosis: induction Intrauterine pregnancy: [redacted]w[redacted]d    Secondary diagnosis:  Principal Problem:   S/P cesarean section Active Problems:   Gestational diabetes, diet controlled  Additional problems: none     Discharge diagnosis: Term Pregnancy Delivered and GDM A1                                                                                                Post partum procedures:none  Augmentation: Pitocin and Cytotec  Complications: None  Hospital course:  Induction of Labor With Cesarean Section  30y.o. yo [id number] 437w2das admitted to the hospital 08/26/2016 for induction of labor. Patient had a labor course significant for development of fetal heart rate decelerations during induction of labor in second stage.  Declined vacuum.  . . The patient went for cesarean section due to Non-Reassuring FHR, and delivered a Viable infant,@BABYSUPPRESS (DBLINK,ept,110,,1,,) Membrane Rupture Time/Date: )  ,08/27/2016   @Details  of operation can be found in separate operative Note.  Patient had an uncomplicated postpartum course. She is ambulating, tolerating a regular diet, passing flatus, and urinating well.  Patient is discharged home in stable condition on 08/30/16.                                     Physical exam Vitals:   08/28/16 1430 08/29/16 0520 08/29/16 1902 08/30/16 0735  BP: 109/67 130/70 133/68 119/89  Pulse: (!) 109 (!) 106 (!) 105 (!) 102  Resp: 20 18 16 18   Temp: 98.4 F (36.9 C) 97.7 F (36.5 C) 98.2 F (36.8 C) 98 F (36.7 C)  TempSrc: Oral Oral Oral Oral  SpO2: 97%  98%   Weight:      Height:       General: alert, cooperative and no distress Lochia: appropriate Uterine Fundus: firm Incision: Healing well with no significant drainage DVT Evaluation: No  evidence of DVT seen on physical exam. Labs: Lab Results  Component Value Date   WBC 11.6 (H) 08/29/2016   HGB 8.4 (L) 08/29/2016   HCT 25.1 (L) 08/29/2016   MCV 91.3 08/29/2016   PLT 118 (L) 08/29/2016   CMP Latest Ref Rng & Units 07/23/2016  Glucose 65 - 104 mg/dL 82    Discharge instruction: per After Visit Summary and "Baby and Me Booklet".  After visit meds:    Medication List    STOP taking these medications   ACCU-CHEK AVIVA PLUS w/Device Kit   ACCU-CHEK FASTCLIX LANCETS Misc   glucose blood test strip     TAKE these medications   docusate sodium 100 MG capsule Commonly known as:  COLACE Take 1 capsule (100 mg total) by mouth 2 (two) times daily. What changed:  when to take this  reasons to take this   ibuprofen 600 MG tablet Commonly known as:  ADVIL,MOTRIN Take  1 tablet (600 mg total) by mouth every 6 (six) hours.   levothyroxine 50 MCG tablet Commonly known as:  SYNTHROID Take 10 tablets (500 mcg total) by mouth daily. What changed:  how much to take   oxyCODONE-acetaminophen 5-325 MG tablet Commonly known as:  PERCOCET/ROXICET Take 1 tablet by mouth every 4 (four) hours as needed for severe pain.   prenatal multivitamin Tabs tablet Take 1 tablet by mouth daily at 12 noon.       Diet: routine diet  Activity: Advance as tolerated. Pelvic rest for 6 weeks.   Outpatient follow up:6 weeks Follow up Appt:Future Appointments Date Time Provider [place]  09/30/2016 10:20 AM [place]   Follow up Visit:No Follow-up on file.  Postpartum contraception: Condoms  Newborn Data: Live born female  Birth Weight: 6 lb 4.2 oz (2840 g) APGAR: 8, 9  Baby Feeding: Breast Disposition:home with mother   08/30/2016 Hansel Feinstein, CNM

## 2016-08-30 NOTE — Discharge Instructions (Signed)
Cesarean Delivery, Care After  Refer to this sheet in the next few weeks. These instructions provide you with information on caring for yourself after your procedure. Your health care provider may also give you specific instructions. Your treatment has been planned according to current medical practices, but problems sometimes occur. Call your health care provider if you have any problems or questions after you go home. HOME CARE INSTRUCTIONS  Only take over-the-counter or prescription medications as directed by your health care provider.  Do not drink alcohol, especially if you are breastfeeding or taking medication to relieve pain.  Do not chew or smoke tobacco.  Continue to use good perineal care. Good perineal care includes:  Wiping your perineum from front to back.  Keeping your perineum clean.  Check your surgical cut (incision) daily for increased redness, drainage, swelling, or separation of skin.  Clean your incision gently with soap and water every day, and then pat it dry. If your health care provider says it is okay, leave the incision uncovered. Use a bandage (dressing) if the incision is draining fluid or appears irritated. If the adhesive strips across the incision do not fall off within 7 days, carefully peel them off.  Hug a pillow when coughing or sneezing until your incision is healed. This helps to relieve pain.  Do not use tampons or douche until your health care provider says it is okay.  Shower, wash your hair, and take tub baths as directed by your health care provider.  Wear a well-fitting bra that provides breast support.  Limit wearing support panties or control-top hose.  Drink enough fluids to keep your urine clear or pale yellow.  Eat high-fiber foods such as whole grain cereals and breads, brown rice, beans, and fresh fruits and vegetables every day. These foods may help prevent or relieve constipation.  Resume activities such as climbing stairs,  driving, lifting, exercising, or traveling as directed by your health care provider.  Talk to your health care provider about resuming sexual activities. This is dependent upon your risk of infection, your rate of healing, and your comfort and desire to resume sexual activity.  Try to have someone help you with your household activities and your newborn for at least a few days after you leave the hospital.  Rest as much as possible. Try to rest or take a nap when your newborn is sleeping.  Increase your activities gradually.  Keep all of your scheduled postpartum appointments. It is very important to keep your scheduled follow-up appointments. At these appointments, your health care provider will be checking to make sure that you are healing physically and emotionally. SEEK MEDICAL CARE IF:   You are passing large clots from your vagina. Save any clots to show your health care provider.  You have a foul smelling discharge from your vagina.  You have trouble urinating.  You are urinating frequently.  You have pain when you urinate.  You have a change in your bowel movements.  You have increasing redness, pain, or swelling near your incision.  You have pus draining from your incision.  Your incision is separating.  You have painful, hard, or reddened breasts.  You have a severe headache.  You have blurred vision or see spots.  You feel sad or depressed.  You have thoughts of hurting yourself or your newborn.  You have questions about your care, the care of your newborn, or medications.  You are dizzy or light-headed.  You have a rash.  You have pain, redness, or swelling at the site of the removed intravenous access (IV) tube.  You have nausea or vomiting.  You stopped breastfeeding and have not had a menstrual period within 12 weeks of stopping.  You are not breastfeeding and have not had a menstrual period within 12 weeks of delivery.  You have a fever. SEEK  IMMEDIATE MEDICAL CARE IF:  You have persistent pain.  You have chest pain.  You have shortness of breath.  You faint.  You have leg pain.  You have stomach pain.  Your vaginal bleeding saturates 2 or more sanitary pads in 1 hour. MAKE SURE YOU:   Understand these instructions.  Will watch your condition.  Will get help right away if you are not doing well or get worse.   This information is not intended to replace advice given to you by your health care provider. Make sure you discuss any questions you have with your health care provider.   Document Released: 07/04/2002 Document Revised: 11/02/2014 Document Reviewed: 06/08/2012 Elsevier Interactive Patient Education 2016 [employer].   Storing Breast Milk Breast milk is a living fluid that contains infection-fighting cells (antibodies). Pre-pumped (expressed) breast milk needs to be stored in a certain way so that it remains effective in protecting your baby against infections. The following guidelines are for storing breast milk for a healthy, full-term infant.  HOW LONG CAN BREAST MILK BE STORED?  Milk can be stored for up to 4 hours at room temperature, or 60-71F (15.6-19.4C). However, it is acceptable to allow the milk to sit for 6-8 hours if the pump parts and containers are well cleaned.  Milk can be stored for 3 days in a refrigerator at less than 734F (3.9C). However, it is acceptable to allow the milk to sit for up to 8 days if the pump parts and containers are well cleaned.  Milk can be stored for 2 weeks in a freezer compartment inside a refrigerator.  Milk can be stored for 3-4 months in a freezer unit with a separate door.  Milk can be stored for 6-12 months in a deep freezer at -34F (-20C). A deep freezer is a chest or stand-alone freezer that is not opened very often and stays at a colder temperature. HOW SHOULD I STORE BREAST MILK?  Milk may be stored in a:  Glass container.  Hard plastic  container.  Plastic bag specially designed for storing milk. Many women like these because they take up less space and can be attached directly to the breast pump.  Store your milk in 2-4 oz (60-120 mL) servings. This makes it easier to thaw the milk. It also helps you avoid having to throw out milk your baby does not drink.  Leave an inch or so at the top of the bag or bottle so the milk has room to expand as it freezes.  Label each container with the date and time the milk was pumped so that you use the milk in the order it was pumped.  If you will be freezing the milk, store it in the back of the freezer. This prevents the milk from being affected by temperature changes due to the freezer door being opened. THAWING FROZEN BREAST MILK  Frozen milk can be thawed:  In a refrigerator.  Under warm-running tap water.  In a pan of warm water that has been heated on the stove.  Do not heat milk directly on the stove or in a microwave as  this will destroy some of its infection-fighting properties.  Thawed milk can be stored in the refrigerator for up to 24 hours but should not be refrozen.  If you want to add freshly pumped milk to the frozen milk, make sure to add less milk than what is already frozen. Chill the fresh milk in your refrigerator for 30 minutes before adding it to the milk in the freezer.   This information is not intended to replace advice given to you by your health care provider. Make sure you discuss any questions you have with your health care provider.   Document Released: 08/09/2009 Document Revised: 10/17/2013 Document Reviewed: 07/24/2013 Elsevier Interactive Patient Education 2016 [employer].  Iron-Rich Diet  Iron is a mineral that helps your body to produce hemoglobin. Hemoglobin is a protein in your red blood cells that carries oxygen to your body's tissues. Eating too little iron may cause you to feel weak and tired, and it can increase your risk for  infection. Eating enough iron is necessary for your body's metabolism, muscle function, and nervous system. Iron is naturally found in many foods. It can also be added to foods or fortified in foods. There are two types of dietary iron:  Heme iron. Heme iron is absorbed by the body more easily than nonheme iron. Heme iron is found in meat, poultry, and fish.  Nonheme iron. Nonheme iron is found in dietary supplements, iron-fortified grains, beans, and vegetables. You may need to follow an iron-rich diet if:  You have been diagnosed with iron deficiency or iron-deficiency anemia.  You have a condition that prevents you from absorbing dietary iron, such as:  Infection in your intestines.  Celiac disease. This involves long-lasting (chronic) inflammation of your intestines.  You do not eat enough iron.  You eat a diet that is high in foods that impair iron absorption.  You have lost a lot of blood.  You have heavy bleeding during your menstrual cycle.  You are pregnant. WHAT IS MY PLAN? Your health care provider may help you to determine how much iron you need per day based on your condition. Generally, when a person consumes sufficient amounts of iron in the diet, the following iron needs are met:  Men.  13-59 years old: 11 mg per day.  16-4 years old: 8 mg per day.  Women.   23-79 years old: 15 mg per day.  20-34 years old: 18 mg per day.  Over 86 years old: 8 mg per day.  Pregnant women: 27 mg per day.  Breastfeeding women: 9 mg per day. WHAT DO I NEED TO KNOW ABOUT AN IRON-RICH DIET?  Eat fresh fruits and vegetables that are high in vitamin C along with foods that are high in iron. This will help increase the amount of iron that your body absorbs from food, especially with foods containing nonheme iron. Foods that are high in vitamin C include oranges, peppers, tomatoes, and mango.  Take iron supplements only as directed by your health care provider. Overdose of  iron can be life-threatening. If you were prescribed iron supplements, take them with orange juice or a vitamin C supplement.  Cook foods in pots and pans that are made from iron.   Eat nonheme iron-containing foods alongside foods that are high in heme iron. This helps to improve your iron absorption.   Certain foods and drinks contain compounds that impair iron absorption. Avoid eating these foods in the same meal as iron-rich foods or with iron  supplements. These include:  Coffee, black tea, and red wine.  Milk, dairy products, and foods that are high in calcium.  Beans, soybeans, and peas.  Whole grains.  When eating foods that contain both nonheme iron and compounds that impair iron absorption, follow these tips to absorb iron better.   Soak beans overnight before cooking.  Soak whole grains overnight and drain them before using.  Ferment flours before baking, such as using yeast in bread dough. WHAT FOODS CAN I EAT? Grains Iron-fortified breakfast cereal. Iron-fortified whole-wheat bread. Enriched rice. Sprouted grains. Vegetables Spinach. Potatoes with skin. Green peas. Broccoli. Red and green bell peppers. Fermented vegetables. Fruits Prunes. Raisins. Oranges. Strawberries. Mango. Grapefruit. Meats and Other Protein Sources Beef liver. Oysters. Beef. Shrimp. [place]. Chicken. [place]. Sardines. Chickpeas. Nuts. Tofu. Beverages Tomato juice. Fresh orange juice. Prune juice. Hibiscus tea. Fortified instant breakfast shakes. Condiments Tahini. Fermented soy sauce. Sweets and Desserts Black-strap molasses.  Other Wheat germ. The items listed above may not be a complete list of recommended foods or beverages. Contact your dietitian for more options. WHAT FOODS ARE NOT RECOMMENDED? Grains Whole grains. Bran cereal. Bran flour. Oats. Vegetables Artichokes. Brussels sprouts. Kale. Fruits Blueberries. Raspberries. Strawberries. Figs. Meats and Other Protein  Sources Soybeans. Products made from soy protein. Dairy Milk. Cream. Cheese. Yogurt. Cottage cheese. Beverages Coffee. Black tea. Red wine. Sweets and Desserts Cocoa. Chocolate. Ice cream. Other Basil. Oregano. Parsley. The items listed above may not be a complete list of foods and beverages to avoid. Contact your dietitian for more information.   This information is not intended to replace advice given to you by your health care provider. Make sure you discuss any questions you have with your health care provider.   Document Released: 05/26/2005 Document Revised: 11/02/2014 Document Reviewed: 05/09/2014 Elsevier Interactive Patient Education [employer].

## 2016-08-30 NOTE — Lactation Note (Signed)
This note was copied from a baby's chart. Lactation Consultation Note  [facility] Discussed OP support services and phone support. Suggest parents call LC when paperwork is completed and for additional bf support.  Patient Name: Ashley Rhodes [id number]'XToday's Date: 08/30/2016     Maternal Data    Feeding Feeding Type: Formula Nipple Type: Slow - flow Length of feed: 25 min  LATCH Score/Interventions                      Lactation Tools Discussed/Used     Consult Status      Dahlia ByesBerkelhammer, Ruth Boschen 08/30/2016, 10:00 AM

## 2016-09-30 ENCOUNTER — Ambulatory Visit (INDEPENDENT_AMBULATORY_CARE_PROVIDER_SITE_OTHER): Payer: Medicaid Other | Admitting: Advanced Practice Midwife

## 2016-09-30 ENCOUNTER — Encounter: Payer: Self-pay | Admitting: Advanced Practice Midwife

## 2016-09-30 DIAGNOSIS — Z98891 History of uterine scar from previous surgery: Secondary | ICD-10-CM

## 2016-09-30 DIAGNOSIS — O24410 Gestational diabetes mellitus in pregnancy, diet controlled: Secondary | ICD-10-CM

## 2016-09-30 DIAGNOSIS — E039 Hypothyroidism, unspecified: Secondary | ICD-10-CM

## 2016-09-30 DIAGNOSIS — Z8632 Personal history of gestational diabetes: Secondary | ICD-10-CM | POA: Insufficient documentation

## 2016-09-30 NOTE — Patient Instructions (Signed)

## 2016-09-30 NOTE — Progress Notes (Signed)
Subjective:     Ashley Rhodes is a 30 y.o. female who presents for a postpartum visit. She is 4 weeks postpartum following a low cervical transverse Cesarean section due to FTP/NRFHR/failed vacuum.    I have fully reviewed the prenatal and intrapartum course. The delivery was at term gestational weeks. Outcome: primary cesarean section, low transverse incision. Anesthesia: epidural. Postpartum course has been uneventful. Baby's course has been uneventful except for slow weight gain. Baby is feeding by breast. Bleeding no bleeding. Bowel function is normal. Bladder function is normal. Patient is not sexually active. Contraception method is none. Postpartum depression screening: negative.  The following portions of the patient's history were reviewed and updated as appropriate: allergies, current medications, past family history, past medical history, past social history, past surgical history and problem list.  Review of Systems Pertinent items are noted in HPI.   Objective:    There were no vitals taken for this visit.  General:  alert, cooperative and no distress   Breasts:  inspection negative, no nipple discharge or bleeding, no masses or nodularity palpable  Lungs: clear to auscultation bilaterally  Heart:  regular rate and rhythm, S1, S2 normal, no murmur, click, rub or gallop  Abdomen: soft, non-tender; bowel sounds normal; no masses,  no organomegaly and Incision well healed   Vulva:  not evaluated  Vagina: not evaluated  Cervix:  n/a  Corpus: not examined  Adnexa:  not evaluated  Rectal Exam: Not performed.        Assessment:     normal postoperative/ postpartum exam. Pap smear not done at today's visit.   Plan:    1. Contraception: condoms 2. Discussed wait two more weeks for full resumption of activity.  Will need to schedule PP glucose test 3. Follow up in: 2 weeks for Glucose TT or as needed.

## 2016-10-21 ENCOUNTER — Encounter: Payer: Self-pay | Admitting: General Practice

## 2016-10-27 ENCOUNTER — Other Ambulatory Visit: Payer: Medicaid Other

## 2016-10-27 DIAGNOSIS — O99814 Abnormal glucose complicating childbirth: Secondary | ICD-10-CM

## 2016-10-28 ENCOUNTER — Encounter: Payer: Self-pay | Admitting: Family Medicine

## 2016-10-28 LAB — GLUCOSE TOLERANCE, 2 HOURS
GLUCOSE, 2 HOUR: 116 mg/dL (ref <140)
GLUCOSE, FASTING: 106 mg/dL — AB (ref 65–99)

## 2018-01-24 ENCOUNTER — Encounter: Payer: Self-pay | Admitting: General Practice

## 2018-01-24 ENCOUNTER — Ambulatory Visit (INDEPENDENT_AMBULATORY_CARE_PROVIDER_SITE_OTHER): Payer: Self-pay

## 2018-01-24 DIAGNOSIS — Z3201 Encounter for pregnancy test, result positive: Secondary | ICD-10-CM

## 2018-01-24 LAB — POCT PREGNANCY, URINE: Preg Test, Ur: POSITIVE — AB

## 2018-01-24 NOTE — Progress Notes (Signed)
I have reviewed the nurse's note, and agree with the plan of care.  Heather Hogan 1:16 PM 01/24/18  

## 2018-01-24 NOTE — Progress Notes (Signed)
Pt came for UPT-positive, states was feeling nauseated. LMP: 12/11/17, EDD: 09/17/18, ga: 6w 2d.Rvwd meds, per provider for pt to stop Fenofibrate for cholesterol.Pty verbally understood.Advsd to start prenatal vitamins.Pt asked for letter confirming pregnancy,front desk.Pt states not sure of prenatatl care here.

## 2018-03-07 ENCOUNTER — Ambulatory Visit (INDEPENDENT_AMBULATORY_CARE_PROVIDER_SITE_OTHER): Payer: Medicaid Other | Admitting: Certified Nurse Midwife

## 2018-03-07 ENCOUNTER — Other Ambulatory Visit (HOSPITAL_COMMUNITY)
Admission: RE | Admit: 2018-03-07 | Discharge: 2018-03-07 | Disposition: A | Discharge status: Home / Self Care | Payer: Medicaid Other | Source: Ambulatory Visit | Attending: Certified Nurse Midwife | Admitting: Certified Nurse Midwife

## 2018-03-07 ENCOUNTER — Encounter: Payer: Self-pay | Admitting: Certified Nurse Midwife

## 2018-03-07 VITALS — BP 102/65 | HR 91 | Wt 139.2 lb

## 2018-03-07 DIAGNOSIS — O0990 Supervision of high risk pregnancy, unspecified, unspecified trimester: Secondary | ICD-10-CM | POA: Insufficient documentation

## 2018-03-07 DIAGNOSIS — O0991 Supervision of high risk pregnancy, unspecified, first trimester: Secondary | ICD-10-CM

## 2018-03-07 DIAGNOSIS — Z98891 History of uterine scar from previous surgery: Secondary | ICD-10-CM

## 2018-03-07 DIAGNOSIS — E039 Hypothyroidism, unspecified: Secondary | ICD-10-CM

## 2018-03-07 HISTORY — DX: Supervision of high risk pregnancy, unspecified, unspecified trimester: O09.90

## 2018-03-07 NOTE — Progress Notes (Signed)
Subjective:   Ashley Rhodes is a 32 y.o. [id number] at [redacted]w[redacted]d by LMP being seen today for her first obstetrical visit.  Her obstetrical history is significant for previous C/S in 08/2016. Patient does intend to breast feed. Pregnancy history fully reviewed.  Patient reports no complaints.  HISTORY: OB History  Gravida Para Term Preterm AB Living  0 4 1  SAB TAB Ectopic Multiple Live Births  4 0 0 0 1    # Outcome Date GA Lbr Len/2nd Weight Sex Delivery Anes PTL Lv  6 Current           5 Term 08/27/16 [redacted]w[redacted]d 03:57 / 03:53 6 lb 4.2 oz (2.84 kg) M CS-LTranv EPI  LIV     Name: POKHAREL KHANAL,BOY Katheleen     Apgar1: 8  Apgar5: 9  4 SAB           3 SAB           2 SAB           1 SAB             Last pap smear was done 2017- per patient, no record of Cytology.   Past Medical History:  Diagnosis Date  . Four previous spontaneous abortions (SAB) affecting care of mother, antepartum   . Gestational diabetes   . Hypothyroidism    Past Surgical History:  Procedure Laterality Date  . CESAREAN SECTION N/A 08/27/2016   Procedure: CESAREAN SECTION;  Surgeon: Tereso Newcomer, MD;  [facility]: [facility];  Service: Obstetrics;  Laterality: N/A;  . DILATION AND CURETTAGE OF UTERUS     Family History  Problem Relation Age of Onset  . Diabetes Mother   . Diabetes Father    Social History   Tobacco Use  . Smoking status: Never Smoker  . Smokeless tobacco: Never Used  Substance Use Topics  . Alcohol use: No  . Drug use: No   No Known Allergies Current Outpatient Medications on File Prior to Visit  Medication Sig Dispense Refill  . fluticasone (FLONASE) 50 MCG/ACT nasal spray fluticasone propionate 50 mcg/actuation nasal spray,suspension    . levothyroxine (SYNTHROID) 50 MCG tablet Take 10 tablets (500 mcg total) by mouth daily. (Patient taking differently: Take 50 mcg by mouth daily. ) 30 tablet 2  . Prenatal Vit-Fe Fumarate-FA (PRENATAL MULTIVITAMIN) TABS  tablet Take 1 tablet by mouth daily at 12 noon.     No current facility-administered medications on file prior to visit.     Review of Systems Pertinent items noted in HPI and remainder of comprehensive ROS otherwise negative.  Exam   Vitals:   03/07/18 0843  BP: 102/65  Pulse: 91  Weight: 139 lb 3.2 oz (63.1 kg)   Fetal Heart Rate (bpm): 148  Pelvic Exam: Perineum: no hemorrhoids, normal perineum   Vulva: normal external genitalia, no lesions   Vagina:  normal mucosa, normal discharge   Cervix: no lesions and normal, pap smear done.    Adnexa: normal adnexa and no mass, fullness, tenderness   Bony Pelvis: average  System: General: well-developed, well-nourished female in no acute distress   Breast:  normal appearance, no masses or tenderness   Skin: normal coloration and turgor, no rashes   Neurologic: oriented, normal, negative, normal mood   Extremities: normal strength, tone, and muscle mass, ROM of all joints is normal   HEENT PERRLA, extraocular movement intact and sclera clear.   Mouth/Teeth mucous membranes  moist, pharynx normal without lesions and dental hygiene good   Neck supple and no masses   Cardiovascular: regular rate and rhythm   Respiratory:  no respiratory distress, normal breath sounds   Abdomen: soft, non-tender; bowel sounds normal; no masses,  no organomegaly     Assessment:   Pregnancy: [id number] Patient Active Problem List   Diagnosis Date Noted  . Supervision of high risk pregnancy, antepartum, first trimester 03/07/2018  . Hypothyroidism 09/30/2016  . History of cesarean delivery 09/30/2016  . History of gestational diabetes 09/30/2016  . Gestational thrombocytopenia (HCC) 07/21/2016  . 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 Supervision of high risk pregnancy, antepartum, first trimester -Patient doing well, no complaints.   - Cytology - PAP - [facility] AMB BABYSCRIPTS OPT IN - Culture, OB Urine - Cystic fibrosis gene test -  Hemoglobinopathy Evaluation - Obstetric Panel, Including HIV - SMN1 COPY NUMBER ANALYSIS (SMA Carrier Screen) - [place] MFM OB DETAIL +14 WK; Future - HgB A1c  2. History of cesarean delivery -Discussed with patient options of TOLAC vs. Repeat Cesarean Section. Answered patient's questions and patient desires repeat C/S over TOLAC.  3. Hypothyroidism, unspecified type -PCP follows Thyroid levels, reports last labs obtained in March/April for Synthroid.  -Patient reports taking of Synthroid daily  - Thyroid Panel With TSH   Initial labs drawn. TSH panel and Hgb A1C drawn.  Continue prenatal vitamins. Genetic Screening discussed, First trimester screen, Quad screen and NIPS: declined. Ultrasound discussed; fetal anatomic survey: ordered. Problem list reviewed and updated. The nature of [facility] with multiple MDs and other Advanced Practice Providers was explained to patient; also emphasized that residents, students are part of our team. Routine obstetric precautions reviewed. Return in about 1 month (around 04/04/2018) for ROB.     Steward Drone, CNM  Center for [employer], [facility]

## 2018-03-07 NOTE — Patient Instructions (Signed)
First Trimester of Pregnancy The first trimester of pregnancy is from week 1 until the end of week 13 (months 1 through 3). During this time, your baby will begin to develop inside you. At 6-8 weeks, the eyes and face are formed, and the heartbeat can be seen on ultrasound. At the end of 12 weeks, all the baby's organs are formed. Prenatal care is all the medical care you receive before the birth of your baby. Make sure you get good prenatal care and follow all of your doctor's instructions. Follow these instructions at home: Medicines  Take over-the-counter and prescription medicines only as told by your doctor. Some medicines are safe and some medicines are not safe during pregnancy.  Take a prenatal vitamin that contains at least 600 micrograms (mcg) of folic acid.  If you have trouble pooping (constipation), take medicine that will make your stool soft (stool softener) if your doctor approves. Eating and drinking  Eat regular, healthy meals.  Your doctor will tell you the amount of weight gain that is right for you.  Avoid raw meat and uncooked cheese.  If you feel sick to your stomach (nauseous) or throw up (vomit): ? Eat 4 or 5 small meals a day instead of 3 large meals. ? Try eating a few soda crackers. ? Drink liquids between meals instead of during meals.  To prevent constipation: ? Eat foods that are high in fiber, like fresh fruits and vegetables, whole grains, and beans. ? Drink enough fluids to keep your pee (urine) clear or pale yellow. Activity  Exercise only as told by your doctor. Stop exercising if you have cramps or pain in your lower belly (abdomen) or low back.  Do not exercise if it is too hot, too humid, or if you are in a place of great height (high altitude).  Try to avoid standing for long periods of time. Move your legs often if you must stand in one place for a long time.  Avoid heavy lifting.  Wear low-heeled shoes. Sit and stand up straight.  You  can have sex unless your doctor tells you not to. Relieving pain and discomfort  Wear a good support bra if your breasts are sore.  Take warm water baths (sitz baths) to soothe pain or discomfort caused by hemorrhoids. Use hemorrhoid cream if your doctor says it is okay.  Rest with your legs raised if you have leg cramps or low back pain.  If you have puffy, bulging veins (varicose veins) in your legs: ? Wear support hose or compression stockings as told by your doctor. ? Raise (elevate) your feet for 15 minutes, 3-4 times a day. ? Limit salt in your food. Prenatal care  Schedule your prenatal visits by the twelfth week of pregnancy.  Write down your questions. Take them to your prenatal visits.  Keep all your prenatal visits as told by your doctor. This is important. Safety  Wear your seat belt at all times when driving.  Make a list of emergency phone numbers. The list should include numbers for family, friends, the hospital, and police and fire departments. General instructions  Ask your doctor for a referral to a local prenatal class. Begin classes no later than at the start of month 6 of your pregnancy.  Ask for help if you need counseling or if you need help with nutrition. Your doctor can give you advice or tell you where to go for help.  Do not use hot tubs, steam rooms, or   saunas.  Do not douche or use tampons or scented sanitary pads.  Do not cross your legs for long periods of time.  Avoid all herbs and alcohol. Avoid drugs that are not approved by your doctor.  Do not use any tobacco products, including cigarettes, chewing tobacco, and electronic cigarettes. If you need help quitting, ask your doctor. You may get counseling or other support to help you quit.  Avoid cat litter boxes and soil used by cats. These carry germs that can cause birth defects in the baby and can cause a loss of your baby (miscarriage) or stillbirth.  Visit your dentist. At home, brush  your teeth with a soft toothbrush. Be gentle when you floss. Contact a doctor if:  You are dizzy.  You have mild cramps or pressure in your lower belly.  You have a nagging pain in your belly area.  You continue to feel sick to your stomach, you throw up, or you have watery poop (diarrhea).  You have a bad smelling fluid coming from your vagina.  You have pain when you pee (urinate).  You have increased puffiness (swelling) in your face, hands, legs, or ankles. Get help right away if:  You have a fever.  You are leaking fluid from your vagina.  You have spotting or bleeding from your vagina.  You have very bad belly cramping or pain.  You gain or lose weight rapidly.  You throw up blood. It may look like coffee grounds.  You are around people who have German measles, fifth disease, or chickenpox.  You have a very bad headache.  You have shortness of breath.  You have any kind of trauma, such as from a fall or a car accident. Summary  The first trimester of pregnancy is from week 1 until the end of week 13 (months 1 through 3).  To take care of yourself and your unborn baby, you will need to eat healthy meals, take medicines only if your doctor tells you to do so, and do activities that are safe for you and your baby.  Keep all follow-up visits as told by your doctor. This is important as your doctor will have to ensure that your baby is healthy and growing well. This information is not intended to replace advice given to you by your health care provider. Make sure you discuss any questions you have with your health care provider. Document Released: 03/30/2008 Document Revised: 10/20/2016 Document Reviewed: 10/20/2016 Elsevier Interactive Patient Education  2017 Elsevier Inc.  

## 2018-03-09 LAB — CYTOLOGY - PAP
Chlamydia: NEGATIVE
Diagnosis: NEGATIVE
HPV: NOT DETECTED
Neisseria Gonorrhea: NEGATIVE

## 2018-03-14 LAB — OBSTETRIC PANEL, INCLUDING HIV
Antibody Screen: NEGATIVE
Basophils Absolute: 0 10*3/uL (ref 0.0–0.2)
Basos: 0 %
EOS (ABSOLUTE): 0.3 10*3/uL (ref 0.0–0.4)
Eos: 5 %
HIV Screen 4th Generation wRfx: NONREACTIVE
Hematocrit: 33.5 % — ABNORMAL LOW (ref 34.0–46.6)
Hemoglobin: 11.2 g/dL (ref 11.1–15.9)
Hepatitis B Surface Ag: NEGATIVE
Immature Grans (Abs): 0 10*3/uL (ref 0.0–0.1)
Immature Granulocytes: 0 %
Lymphocytes Absolute: 2 10*3/uL (ref 0.7–3.1)
Lymphs: 33 %
MCH: 29.9 pg (ref 26.6–33.0)
MCHC: 33.4 g/dL (ref 31.5–35.7)
MCV: 90 fL (ref 79–97)
Monocytes Absolute: 0.5 10*3/uL (ref 0.1–0.9)
Monocytes: 8 %
Neutrophils Absolute: 3.2 10*3/uL (ref 1.4–7.0)
Neutrophils: 54 %
Platelets: 176 10*3/uL (ref 150–379)
RBC: 3.74 x10E6/uL — ABNORMAL LOW (ref 3.77–5.28)
RDW: 13.7 % (ref 12.3–15.4)
RPR Ser Ql: NONREACTIVE
Rh Factor: POSITIVE
Rubella Antibodies, IGG: 7.66 index (ref >0.99)
WBC: 6 10*3/uL (ref 3.4–10.8)

## 2018-03-14 LAB — SMN1 COPY NUMBER ANALYSIS (SMA CARRIER SCREENING)

## 2018-03-14 LAB — HEMOGLOBIN A1C
Est. average glucose Bld gHb Est-mCnc: 94 mg/dL
Hgb A1c MFr Bld: 4.9 % (ref 4.8–5.6)

## 2018-03-14 LAB — HEMOGLOBINOPATHY EVALUATION
Ferritin: 53 ng/mL (ref 15–150)
Hgb A2 Quant: 2.7 % (ref 1.8–3.2)
Hgb A: 97.3 % (ref 96.4–98.8)
Hgb C: 0 %
Hgb F Quant: 0 % (ref 0.0–2.0)
Hgb S: 0 %
Hgb Solubility: NEGATIVE
Hgb Variant: 0 %

## 2018-03-14 LAB — THYROID PANEL WITH TSH
Free Thyroxine Index: 3.1 (ref 1.2–4.9)
T3 Uptake Ratio: 18 % — ABNORMAL LOW (ref 24–39)
T4, Total: 17.2 ug/dL — ABNORMAL HIGH (ref 4.5–12.0)
TSH: 0.142 u[IU]/mL — ABNORMAL LOW (ref 0.450–4.500)

## 2018-03-14 LAB — CYSTIC FIBROSIS GENE TEST

## 2018-04-12 ENCOUNTER — Ambulatory Visit (INDEPENDENT_AMBULATORY_CARE_PROVIDER_SITE_OTHER): Payer: Medicaid Other | Admitting: Family Medicine

## 2018-04-12 VITALS — BP 99/64 | HR 85 | Wt 142.9 lb

## 2018-04-12 DIAGNOSIS — O0992 Supervision of high risk pregnancy, unspecified, second trimester: Secondary | ICD-10-CM

## 2018-04-12 DIAGNOSIS — Z98891 History of uterine scar from previous surgery: Secondary | ICD-10-CM

## 2018-04-12 DIAGNOSIS — E039 Hypothyroidism, unspecified: Secondary | ICD-10-CM

## 2018-04-12 DIAGNOSIS — O0991 Supervision of high risk pregnancy, unspecified, first trimester: Secondary | ICD-10-CM

## 2018-04-12 DIAGNOSIS — G5712 Meralgia paresthetica, left lower limb: Secondary | ICD-10-CM | POA: Insufficient documentation

## 2018-04-12 NOTE — Progress Notes (Signed)
Pt states is experiencing some numbness & tingling in her left thigh.

## 2018-04-12 NOTE — Progress Notes (Signed)
   PRENATAL VISIT NOTE  Subjective:  Kori Madaline Savageokharel Khanal is a 32 y.o. [id number] at 610w3d being seen today for ongoing prenatal care.  She is currently monitored for the following issues for this high-risk pregnancy and has Uterine synechiae; Hypothyroidism; History of cesarean delivery; History of gestational diabetes; and Supervision of high risk pregnancy, antepartum, first trimester on their problem list.  Patient reports numbness and tingling on left upper thigh.  Contractions: Not present. Vag. Bleeding: None.  Movement: Present. Denies leaking of fluid.   The following portions of the patient's history were reviewed and updated as appropriate: allergies, current medications, past family history, past medical history, past social history, past surgical history and problem list. Problem list updated.  Objective:   Vitals:   04/12/18 1152  BP: 99/64  Pulse: 85  Weight: 142 lb 14.4 oz (64.8 kg)    Fetal Status: Fetal Heart Rate (bpm): 146   Movement: Present     General:  Alert, oriented and cooperative. Patient is in no acute distress.  Skin: Skin is warm and dry. No rash noted.   Cardiovascular: Normal heart rate noted  Respiratory: Normal respiratory effort, no problems with respiration noted  Abdomen: Soft, gravid, appropriate for gestational age.  Pain/Pressure: Present     Pelvic: Cervical exam deferred        Extremities: Normal range of motion.  Edema: None  Mental Status: Normal mood and affect. Normal behavior. Normal judgment and thought content.   Assessment and Plan:  Pregnancy: [id number] at 7610w3d  1. Supervision of high risk pregnancy, antepartum, first trimester - Anatomy scan on 04/25/18  2. History of cesarean delivery Pt expressed desire for repeat.  - Dicussed again later in pregnancy  3. Hypothyroidism - On synthroid 100 mcg. First trimester TSH was suppressed, but dose not changed. Suspect since she would normally require high dose now, it may have  normalized. - Recheck TSH (given lab already closed, but do next visit)  4. Meralgia paraesthetica: distribution of symptoms c/w with this - Discussed etiology. Provided reassurance and discussed symptomatic relief measures  Preterm labor symptoms and general obstetric precautions including but not limited to vaginal bleeding, contractions, leaking of fluid and fetal movement were reviewed in detail with the patient. Please refer to After Visit Summary for other counseling recommendations.  [facility] 1 [facility] (around 05[facility]10/2018[facility]  Future Appointments  Date Time Provider Department Center  04/25/2018  8:45 AM [facility] US 2 [facility]  05/11/2018  8:55 AM [place] BingPickens, Charlie, MD [facility]    Frederik PearJulie P Ferrell [place], MD

## 2018-04-15 ENCOUNTER — Encounter (HOSPITAL_COMMUNITY): Payer: Self-pay

## 2018-04-25 ENCOUNTER — Ambulatory Visit (HOSPITAL_COMMUNITY)
Admission: RE | Admit: 2018-04-25 | Discharge: 2018-04-25 | Disposition: A | Discharge status: Home / Self Care | Payer: Medicaid Other | Source: Ambulatory Visit | Attending: Certified Nurse Midwife | Admitting: Certified Nurse Midwife

## 2018-04-25 ENCOUNTER — Other Ambulatory Visit: Payer: Self-pay | Admitting: Certified Nurse Midwife

## 2018-04-25 DIAGNOSIS — Z3A19 19 weeks gestation of pregnancy: Secondary | ICD-10-CM | POA: Diagnosis not present

## 2018-04-25 DIAGNOSIS — O34219 Maternal care for unspecified type scar from previous cesarean delivery: Secondary | ICD-10-CM | POA: Insufficient documentation

## 2018-04-25 DIAGNOSIS — O99282 Endocrine, nutritional and metabolic diseases complicating pregnancy, second trimester: Secondary | ICD-10-CM

## 2018-04-25 DIAGNOSIS — O09292 Supervision of pregnancy with other poor reproductive or obstetric history, second trimester: Secondary | ICD-10-CM

## 2018-04-25 DIAGNOSIS — E039 Hypothyroidism, unspecified: Secondary | ICD-10-CM | POA: Diagnosis not present

## 2018-04-25 DIAGNOSIS — O0992 Supervision of high risk pregnancy, unspecified, second trimester: Secondary | ICD-10-CM

## 2018-04-25 DIAGNOSIS — Z363 Encounter for antenatal screening for malformations: Secondary | ICD-10-CM | POA: Diagnosis present

## 2018-04-25 DIAGNOSIS — O0991 Supervision of high risk pregnancy, unspecified, first trimester: Secondary | ICD-10-CM

## 2018-04-25 DIAGNOSIS — Z8632 Personal history of gestational diabetes: Secondary | ICD-10-CM | POA: Insufficient documentation

## 2018-05-11 ENCOUNTER — Ambulatory Visit (INDEPENDENT_AMBULATORY_CARE_PROVIDER_SITE_OTHER): Payer: Medicaid Other | Admitting: Obstetrics and Gynecology

## 2018-05-11 VITALS — BP 104/76 | HR 96 | Wt 146.0 lb

## 2018-05-11 DIAGNOSIS — O0991 Supervision of high risk pregnancy, unspecified, first trimester: Secondary | ICD-10-CM

## 2018-05-11 DIAGNOSIS — Z98891 History of uterine scar from previous surgery: Secondary | ICD-10-CM

## 2018-05-11 DIAGNOSIS — E039 Hypothyroidism, unspecified: Secondary | ICD-10-CM

## 2018-05-11 DIAGNOSIS — L309 Dermatitis, unspecified: Secondary | ICD-10-CM

## 2018-05-11 LAB — POCT URINALYSIS DIP (DEVICE)
Bilirubin Urine: NEGATIVE
GLUCOSE, UA: NEGATIVE mg/dL
KETONES UR: NEGATIVE mg/dL
LEUKOCYTES UA: NEGATIVE
Nitrite: NEGATIVE
Protein, ur: NEGATIVE mg/dL
SPECIFIC GRAVITY, URINE: 1.015 (ref 1.005–1.030)
Urobilinogen, UA: 0.2 mg/dL (ref 0.0–1.0)
pH: 7 (ref 5.0–8.0)

## 2018-05-11 MED ORDER — DESONIDE 0.05 % EX LOTN: TOPICAL_LOTION | CUTANEOUS | 1 refills | Status: AC

## 2018-05-11 NOTE — Progress Notes (Signed)
Prenatal Visit Note Date: 05/11/2018 [facility]  Subjective:  Ashley Rhodes is a 32 y.o. [phone] at 7560w4d being seen today for ongoing prenatal care.  She is currently monitored for the following issues for this high-risk pregnancy and has Uterine synechiae; Hypothyroidism; History of cesarean delivery; History of gestational diabetes; Supervision of high risk pregnancy, antepartum, first trimester; and Meralgia paraesthetica, left on their problem list.  Patient reports hands eczema. OTC lotions didn't help.   Contractions: Not present. Vag. Bleeding: None.  Movement: Present. Denies leaking of fluid.   The following portions of the patient's history were reviewed and updated as appropriate: allergies, current medications, past family history, past medical history, past social history, past surgical history and problem list. Problem list updated.  Objective:   Vitals:   05/11/18 0932  BP: 104/76  Pulse: 96  Weight: 146 lb (66.2 kg)    Fetal Status: Fetal Heart Rate (bpm): 140s   Movement: Present     General:  Alert, oriented and cooperative. Patient is in no acute distress.  Skin: Skin is warm and dry. No rash noted.   Cardiovascular: Normal heart rate noted  Respiratory: Normal respiratory effort, no problems with respiration noted  Abdomen: Soft, gravid, appropriate for gestational age. Pain/Pressure: Present     Pelvic:  Cervical exam deferred        Extremities: Normal range of motion.  Edema: None  Mental Status: Normal mood and affect. Normal behavior. Normal judgment and thought content.   Urinalysis:      Assessment and Plan:  Pregnancy: [id number] at 6560w4d  1. Supervision of high risk pregnancy, antepartum, first trimester Routine care  2. History of cesarean delivery D/w pt later in pregnancy  3. Hypothyroidism, unspecified type Continue synthroid 100 qday. Recheck tsh with 28wk labs  4. Eczema, unspecified type On inner  fingers b/l. otc ecurerin and desonide lotion Rx sent in. Can reassess at nv.   Preterm labor symptoms and general obstetric precautions including but not limited to vaginal bleeding, contractions, leaking of fluid and fetal movement were reviewed in detail with the patient. Please refer to After Visit Summary for other counseling recommendations.  Return in about 3 weeks (around 06/01/2018) for hrob.   [place] BingPickens, Neziah Vogelgesang, MD

## 2018-05-13 LAB — CULTURE, OB URINE

## 2018-05-13 LAB — URINE CULTURE, OB REFLEX

## 2018-06-01 ENCOUNTER — Ambulatory Visit (INDEPENDENT_AMBULATORY_CARE_PROVIDER_SITE_OTHER): Payer: Medicaid Other | Admitting: Obstetrics and Gynecology

## 2018-06-01 VITALS — BP 114/78 | HR 96 | Wt 152.0 lb

## 2018-06-01 DIAGNOSIS — Z98891 History of uterine scar from previous surgery: Secondary | ICD-10-CM

## 2018-06-01 DIAGNOSIS — N856 Intrauterine synechiae: Secondary | ICD-10-CM

## 2018-06-01 DIAGNOSIS — E038 Other specified hypothyroidism: Secondary | ICD-10-CM

## 2018-06-01 DIAGNOSIS — O99282 Endocrine, nutritional and metabolic diseases complicating pregnancy, second trimester: Secondary | ICD-10-CM | POA: Diagnosis not present

## 2018-06-01 DIAGNOSIS — O34219 Maternal care for unspecified type scar from previous cesarean delivery: Secondary | ICD-10-CM

## 2018-06-01 MED ORDER — PSYLLIUM 28 % PO PACK: 1.0000 | PACK | Freq: Two times a day (BID) | ORAL | 4 refills | Status: DC

## 2018-06-01 NOTE — Progress Notes (Signed)
Prenatal Visit Note Date: 06/01/2018 [facility]  Subjective:  Ashley [name] is a 32 y.o. [phone] at 4035w4d being seen today for ongoing prenatal care.  She is currently monitored for the following issues for this high-risk pregnancy and has Uterine synechiae; Hypothyroidism; History of cesarean delivery; History of gestational diabetes; Supervision of high risk pregnancy, antepartum; and Meralgia paraesthetica, left on their problem list.  Patient reports constipation.  Contractions: Not present. Vag. Bleeding: None.  Movement: Present. Denies leaking of fluid.   The following portions of the patient's history were reviewed and updated as appropriate: allergies, current medications, past family history, past medical history, past social history, past surgical history and problem list. Problem list updated.  Objective:   Vitals:   06/01/18 1438  BP: 114/78  Pulse: 96  Weight: 152 lb (68.9 kg)    Fetal Status: Fetal Heart Rate (bpm): 135   Movement: Present     General:  Alert, oriented and cooperative. Patient is in no acute distress.  Skin: Skin is warm and dry. No rash noted.   Cardiovascular: Normal heart rate noted  Respiratory: Normal respiratory effort, no problems with respiration noted  Abdomen: Soft, gravid, appropriate for gestational age. Pain/Pressure: Present     Pelvic:  Cervical exam deferred        Extremities: Normal range of motion.  Edema: None  Mental Status: Normal mood and affect. Normal behavior. Normal judgment and thought content.   Urinalysis:      Assessment and Plan:  Pregnancy: [id number] at 6935w4d  1. Other specified hypothyroidism Continue 100 qday. Recheck tsh nv.  2. History of cesarean delivery D/w more pt at 28-30wks re: delivery plan  3. Pregnancy 28wk labs nv. Hands are better. Recommend tapering steroid cream down to 2x/week. Metamucil for constipation  Preterm labor symptoms and general obstetric  precautions including but not limited to vaginal bleeding, contractions, leaking of fluid and fetal movement were reviewed in detail with the patient. Please refer to After Visit Summary for other counseling recommendations.  Return in about 3 weeks (around 06/22/2018) for hrob, 2hr GTT.   [place] BingPickens, Kemp Gomes, MD

## 2018-06-01 NOTE — Progress Notes (Signed)
Home Medicaid Form Completed  

## 2018-06-22 ENCOUNTER — Other Ambulatory Visit: Payer: Self-pay

## 2018-06-22 DIAGNOSIS — O0990 Supervision of high risk pregnancy, unspecified, unspecified trimester: Secondary | ICD-10-CM

## 2018-06-24 ENCOUNTER — Ambulatory Visit (INDEPENDENT_AMBULATORY_CARE_PROVIDER_SITE_OTHER): Payer: Medicaid Other | Admitting: Obstetrics and Gynecology

## 2018-06-24 ENCOUNTER — Encounter: Payer: Self-pay | Admitting: Obstetrics and Gynecology

## 2018-06-24 ENCOUNTER — Other Ambulatory Visit: Payer: Medicaid Other

## 2018-06-24 VITALS — BP 116/71 | HR 87 | Wt 152.3 lb

## 2018-06-24 DIAGNOSIS — Z8632 Personal history of gestational diabetes: Secondary | ICD-10-CM

## 2018-06-24 DIAGNOSIS — O0990 Supervision of high risk pregnancy, unspecified, unspecified trimester: Secondary | ICD-10-CM

## 2018-06-24 DIAGNOSIS — E038 Other specified hypothyroidism: Secondary | ICD-10-CM

## 2018-06-24 DIAGNOSIS — Z98891 History of uterine scar from previous surgery: Secondary | ICD-10-CM

## 2018-06-24 NOTE — Progress Notes (Signed)
Subjective:  Ashley Rhodes is a 32 y.o. [id number] at 6079w6d being seen today for ongoing prenatal care.  She is currently monitored for the following issues for this high-risk pregnancy and has Uterine synechiae; Hypothyroidism; History of cesarean delivery; History of gestational diabetes; Supervision of high risk pregnancy, antepartum; and Meralgia paraesthetica, left on their problem list.  Patient reports no complaints.  Contractions: Irregular. Vag. Bleeding: None.  Movement: Present. Denies leaking of fluid.   The following portions of the patient's history were reviewed and updated as appropriate: allergies, current medications, past family history, past medical history, past social history, past surgical history and problem list. Problem list updated.  Objective:   Vitals:   06/24/18 0908  BP: 116/71  Pulse: 87  Weight: 152 lb 4.8 oz (69.1 kg)    Fetal Status: Fetal Heart Rate (bpm): 135   Movement: Present     General:  Alert, oriented and cooperative. Patient is in no acute distress.  Skin: Skin is warm and dry. No rash noted.   Cardiovascular: Normal heart rate noted  Respiratory: Normal respiratory effort, no problems with respiration noted  Abdomen: Soft, gravid, appropriate for gestational age. Pain/Pressure: Present     Pelvic:  Cervical exam deferred        Extremities: Normal range of motion.  Edema: Trace  Mental Status: Normal mood and affect. Normal behavior. Normal judgment and thought content.   Urinalysis:      Assessment and Plan:  Pregnancy: [id number] at 8379w6d  1. Supervision of high risk pregnancy, antepartum Stable Glucola today  2. Other specified hypothyroidism Stable on Synthyroid  3. History of gestational diabetes Glucola today  4. History of cesarean delivery Counseled on TOLAC vs Repeat c section Pt desires repeat c section Papers signed today  Preterm labor symptoms and general obstetric precautions including but not limited to  vaginal bleeding, contractions, leaking of fluid and fetal movement were reviewed in detail with the patient. Please refer to After Visit Summary for other counseling recommendations.  Return in about 2 weeks (around 07/08/2018) for OB visit.   Hermina StaggersErvin, Michael L, MD

## 2018-06-24 NOTE — Progress Notes (Signed)
Declined tdap

## 2018-06-24 NOTE — Patient Instructions (Signed)

## 2018-06-25 LAB — CBC
Hematocrit: 34.2 % (ref 34.0–46.6)
Hemoglobin: 11.3 g/dL (ref 11.1–15.9)
MCH: 29.4 pg (ref 26.6–33.0)
MCHC: 33 g/dL (ref 31.5–35.7)
MCV: 89 fL (ref 79–97)
Platelets: 147 10*3/uL — ABNORMAL LOW (ref 150–450)
RBC: 3.84 x10E6/uL (ref 3.77–5.28)
RDW: 14.3 % (ref 12.3–15.4)
WBC: 8.1 10*3/uL (ref 3.4–10.8)

## 2018-06-25 LAB — GLUCOSE TOLERANCE, 2 HOURS W/ 1HR
GLUCOSE, 2 HOUR: 185 mg/dL — AB (ref 65–152)
GLUCOSE, FASTING: 85 mg/dL (ref 65–91)
Glucose, 1 hour: 200 mg/dL — ABNORMAL HIGH (ref 65–179)

## 2018-06-25 LAB — HIV ANTIBODY (ROUTINE TESTING W REFLEX): HIV SCREEN 4TH GENERATION: NONREACTIVE

## 2018-06-25 LAB — RPR: RPR Ser Ql: NONREACTIVE

## 2018-06-28 ENCOUNTER — Telehealth: Payer: Self-pay

## 2018-06-28 NOTE — Telephone Encounter (Addendum)
-----   Message from Hermina Staggers, MD sent at 06/28/2018 10:24 AM EDT ----- Please let pt know that she failed her glucola and has GDM. Refer to Diabetic educator Also need to repeat plt count with next OB visit Thanks Casimiro Needle  LM for pt to please return call in regards to results and an appt.  MyChart message has been sent.

## 2018-06-30 ENCOUNTER — Ambulatory Visit: Payer: Medicaid Other | Admitting: *Deleted

## 2018-06-30 ENCOUNTER — Encounter: Payer: Medicaid Other | Attending: Obstetrics and Gynecology | Admitting: *Deleted

## 2018-06-30 DIAGNOSIS — O24410 Gestational diabetes mellitus in pregnancy, diet controlled: Secondary | ICD-10-CM | POA: Insufficient documentation

## 2018-06-30 DIAGNOSIS — Z713 Dietary counseling and surveillance: Secondary | ICD-10-CM | POA: Diagnosis present

## 2018-06-30 DIAGNOSIS — Z8632 Personal history of gestational diabetes: Secondary | ICD-10-CM

## 2018-06-30 MED ORDER — ACCU-CHEK GUIDE W/DEVICE KIT
1.0000 | PACK | Freq: Four times a day (QID) | 0 refills | Status: DC
Start: 2018-06-30 — End: 2018-09-06

## 2018-06-30 MED ORDER — ACCU-CHEK FASTCLIX LANCETS MISC: 1.0000 | Freq: Four times a day (QID) | 12 refills | Status: DC

## 2018-06-30 MED ORDER — GLUCOSE BLOOD VI STRP: ORAL_STRIP | 12 refills | Status: DC

## 2018-07-05 NOTE — Progress Notes (Signed)
  Patient was seen on 06/30/2018 for Gestational Diabetes self-management. EDD 09/17/2018. Patient states history of GDM with last pregnancy. Diet history obtained. Patient eats good variety of all food groups. Beverages include water and milk.  The following learning objectives were met by the patient :   States the definition of Gestational Diabetes  States why dietary management is important in controlling blood glucose  Describes the effects of carbohydrates on blood glucose levels  Demonstrates ability to create a balanced meal plan  Demonstrates carbohydrate counting   States when to check blood glucose levels  Demonstrates proper blood glucose monitoring techniques  States the effect of stress and exercise on blood glucose levels  States the importance of limiting caffeine and abstaining from alcohol and smoking  Plan:  Aim for 3 Carb Choices per meal (45 grams) +/- 1 either way  Aim for 1-2 Carbs per snack Begin reading food labels for Total Carbohydrate of foods If OK with your MD, consider  increasing your activity level by walking, Arm Chair Exercises or other activity daily as tolerated Begin checking BG before breakfast and 2 hours after first bite of breakfast, lunch and dinner as directed by MD  Bring Log Book/Sheet to every medical appointment  OR Baby Scripts:  Patient was introduced to [employer] and plans to use as record of BG electronically  Take medication if directed by MD  Blood glucose monitor Rx called into pharmacy: Accu Check Guide with Fast Clix drums Patient instructed to test pre breakfast and 2 hours each meal as directed by MD  Patient instructed to monitor glucose levels: FBS: 60 - 95 mg/dl 2 hour: <120 mg/dl  Patient received the following handouts:  Nutrition Diabetes and Pregnancy  Carbohydrate Counting List  BG Log Sheet  Patient will be seen for follow-up as needed.

## 2018-07-08 ENCOUNTER — Encounter: Payer: Self-pay | Admitting: Obstetrics & Gynecology

## 2018-07-08 ENCOUNTER — Ambulatory Visit (INDEPENDENT_AMBULATORY_CARE_PROVIDER_SITE_OTHER): Payer: Medicaid Other | Admitting: Obstetrics & Gynecology

## 2018-07-08 VITALS — BP 106/61 | HR 86 | Wt 155.6 lb

## 2018-07-08 DIAGNOSIS — Z98891 History of uterine scar from previous surgery: Secondary | ICD-10-CM

## 2018-07-08 DIAGNOSIS — O0993 Supervision of high risk pregnancy, unspecified, third trimester: Secondary | ICD-10-CM

## 2018-07-08 DIAGNOSIS — O0990 Supervision of high risk pregnancy, unspecified, unspecified trimester: Secondary | ICD-10-CM

## 2018-07-08 DIAGNOSIS — O24410 Gestational diabetes mellitus in pregnancy, diet controlled: Secondary | ICD-10-CM

## 2018-07-08 LAB — HEMOGLOBIN A1C
Est. average glucose Bld gHb Est-mCnc: 111 mg/dL
HEMOGLOBIN A1C: 5.5 % (ref 4.8–5.6)

## 2018-07-08 NOTE — Progress Notes (Signed)
   PRENATAL VISIT NOTE  Subjective:  Ashley Rhodes is a 32 y.o. [id number] at 9060w6d being seen today for ongoing prenatal care.  She is currently monitored for the following issues for this high-risk pregnancy and has Uterine synechiae; Hypothyroidism; History of cesarean delivery; History of gestational diabetes; Supervision of high risk pregnancy, antepartum; and Meralgia paraesthetica, left on their problem list.  Patient reports vaginal pressure.  Contractions: Irritability. Vag. Bleeding: None.  Movement: Present. Denies leaking of fluid.   The following portions of the patient's history were reviewed and updated as appropriate: allergies, current medications, past family history, past medical history, past social history, past surgical history and problem list. Problem list updated.  Objective:   Vitals:   07/08/18 1003  BP: 106/61  Pulse: 86  Weight: 155 lb 9.6 oz (70.6 kg)    Fetal Status: Fetal Heart Rate (bpm): 154   Movement: Present     General:  Alert, oriented and cooperative. Patient is in no acute distress.  Skin: Skin is warm and dry. No rash noted.   Cardiovascular: Normal heart rate noted  Respiratory: Normal respiratory effort, no problems with respiration noted  Abdomen: Soft, gravid, appropriate for gestational age.  Pain/Pressure: Present     Pelvic: Cervical exam performed        Extremities: Normal range of motion.  Edema: Trace  Mental Status: Normal mood and affect. Normal behavior. Normal judgment and thought content.   Assessment and Plan:  Pregnancy: [id number] at 5260w6d  1. Supervision of high risk pregnancy, antepartum   2. History of cesarean delivery - done for CPD - plans RLTCS  3. GDM- she does not have the glucose monitor yet, plans to get it today. - she saw the dietician, working on her diet - She should have sugars recorded when she comes back next week - HBA1C today  Preterm labor symptoms and general obstetric precautions  including but not limited to vaginal bleeding, contractions, leaking of fluid and fetal movement were reviewed in detail with the patient. Please refer to After Visit Summary for other counseling recommendations.  No follow-ups on file.  No future appointments.  Allie BossierMyra C Hailley Byers, MD

## 2018-07-18 ENCOUNTER — Encounter (HOSPITAL_COMMUNITY): Payer: Self-pay

## 2018-07-18 ENCOUNTER — Ambulatory Visit (INDEPENDENT_AMBULATORY_CARE_PROVIDER_SITE_OTHER): Payer: Medicaid Other | Admitting: Family Medicine

## 2018-07-18 VITALS — BP 111/64 | HR 87 | Wt 156.2 lb

## 2018-07-18 DIAGNOSIS — O24410 Gestational diabetes mellitus in pregnancy, diet controlled: Secondary | ICD-10-CM

## 2018-07-18 DIAGNOSIS — O0990 Supervision of high risk pregnancy, unspecified, unspecified trimester: Secondary | ICD-10-CM

## 2018-07-18 DIAGNOSIS — E038 Other specified hypothyroidism: Secondary | ICD-10-CM

## 2018-07-18 DIAGNOSIS — O24415 Gestational diabetes mellitus in pregnancy, controlled by oral hypoglycemic drugs: Secondary | ICD-10-CM

## 2018-07-18 DIAGNOSIS — Z98891 History of uterine scar from previous surgery: Secondary | ICD-10-CM

## 2018-07-18 DIAGNOSIS — E663 Overweight: Secondary | ICD-10-CM | POA: Insufficient documentation

## 2018-07-18 DIAGNOSIS — N979 Female infertility, unspecified: Secondary | ICD-10-CM | POA: Insufficient documentation

## 2018-07-18 MED ORDER — METFORMIN HCL 500 MG PO TABS: 500.0000 mg | ORAL_TABLET | Freq: Two times a day (BID) | ORAL | 1 refills | Status: DC

## 2018-07-18 NOTE — Patient Instructions (Signed)

## 2018-07-18 NOTE — Progress Notes (Signed)
   PRENATAL VISIT NOTE  Subjective:  Erza Madaline Savageokharel Khanal is a 32 y.o. [id number] at 6461w2d being seen today for ongoing prenatal care.  She is currently monitored for the following issues for this high-risk pregnancy and has Uterine synechiae; Gestational diabetes mellitus (GDM) controlled on oral hypoglycemic drug; Hypothyroidism; History of cesarean delivery; History of gestational diabetes; Supervision of high risk pregnancy, antepartum; Meralgia paraesthetica, left; Overweight with body mass index (BMI) 25.0-29.9; and Secondary female infertility on their problem list.  Patient reports no complaints.  Contractions: Not present. Vag. Bleeding: None.  Movement: Present. Denies leaking of fluid.   The following portions of the patient's history were reviewed and updated as appropriate: allergies, current medications, past family history, past medical history, past social history, past surgical history and problem list. Problem list updated.  Objective:   Vitals:   07/18/18 1019  BP: 111/64  Pulse: 87  Weight: 156 lb 3.2 oz (70.9 kg)    Fetal Status: Fetal Heart Rate (bpm): 144 Fundal Height: 31 cm Movement: Present     General:  Alert, oriented and cooperative. Patient is in no acute distress.  Skin: Skin is warm and dry. No rash noted.   Cardiovascular: Normal heart rate noted  Respiratory: Normal respiratory effort, no problems with respiration noted  Abdomen: Soft, gravid, appropriate for gestational age.  Pain/Pressure: Present     Pelvic: Cervical exam deferred        Extremities: Normal range of motion.  Edema: Trace  Mental Status: Normal mood and affect. Normal behavior. Normal judgment and thought content.   Assessment and Plan:  Pregnancy: [id number] at 3561w2d  1. History of cesarean delivery For RLTCS--message sent  2. Supervision of high risk pregnancy, antepartum - CHL AMB BABYSCRIPTS OPT IN  3. Other specified hypothyroidism Repeat labs--not checked since may -  TSH  4. Diet controlled gestational diabetes mellitus (GDM) in third trimester FBS 91, 113, 86, 92, 94, 92, 98, 116 (1/3 abnl)  2 hour pp 150, 156, 113, 94, 134, 159, 127, 128, 136, 129, 106, 122, 136, 151, 116 (2/3 abnl) Diet seems ok-->add exercise, 10 minute walk after meals Add metformin - metFORMIN (GLUCOPHAGE) 500 MG tablet; Take 1 tablet (500 mg total) by mouth 2 (two) times daily with a meal.  Dispense: 60 tablet; Refill: 1   Preterm labor symptoms and general obstetric precautions including but not limited to vaginal bleeding, contractions, leaking of fluid and fetal movement were reviewed in detail with the patient. Please refer to After Visit Summary for other counseling recommendations.  Return in 1 week (on 07/25/2018) for needs MD, OB visit and BPP.  No future appointments.  Reva Boresanya S Jeromie Gainor, MD

## 2018-07-18 NOTE — Progress Notes (Signed)
Pt states having a lot of back pain, [facility]

## 2018-07-19 LAB — TSH: TSH: 1.31 u[IU]/mL (ref 0.450–4.500)

## 2018-07-25 ENCOUNTER — Ambulatory Visit (INDEPENDENT_AMBULATORY_CARE_PROVIDER_SITE_OTHER): Payer: Medicaid Other | Admitting: *Deleted

## 2018-07-25 ENCOUNTER — Ambulatory Visit (INDEPENDENT_AMBULATORY_CARE_PROVIDER_SITE_OTHER): Payer: Medicaid Other | Admitting: Obstetrics & Gynecology

## 2018-07-25 ENCOUNTER — Ambulatory Visit: Payer: Self-pay

## 2018-07-25 VITALS — BP 116/66 | HR 76 | Wt 155.8 lb

## 2018-07-25 DIAGNOSIS — O24415 Gestational diabetes mellitus in pregnancy, controlled by oral hypoglycemic drugs: Secondary | ICD-10-CM

## 2018-07-25 DIAGNOSIS — O0990 Supervision of high risk pregnancy, unspecified, unspecified trimester: Secondary | ICD-10-CM

## 2018-07-25 DIAGNOSIS — E039 Hypothyroidism, unspecified: Secondary | ICD-10-CM

## 2018-07-25 DIAGNOSIS — O0993 Supervision of high risk pregnancy, unspecified, third trimester: Secondary | ICD-10-CM

## 2018-07-25 NOTE — Progress Notes (Signed)
   PRENATAL VISIT NOTE  Subjective:  Ashley Rhodes is a 32 y.o. [id number] at [redacted]w[redacted]d being seen today for ongoing prenatal care.  She is currently monitored for the following issues for this high-risk pregnancy and has Uterine synechiae; Gestational diabetes mellitus (GDM) controlled on oral hypoglycemic drug; Hypothyroidism; History of cesarean delivery; History of gestational diabetes; Supervision of high risk pregnancy, antepartum; Meralgia paraesthetica, left; Overweight with body mass index (BMI) 25.0-29.9; and Secondary female infertility on their problem list.  Patient reports occasional contractions.   .  .   . Denies leaking of fluid.   The following portions of the patient's history were reviewed and updated as appropriate: allergies, current medications, past family history, past medical history, past social history, past surgical history and problem list. Problem list updated.  Objective:  There were no vitals filed for this visit.  Fetal Status:           General:  Alert, oriented and cooperative. Patient is in no acute distress.  Skin: Skin is warm and dry. No rash noted.   Cardiovascular: Normal heart rate noted  Respiratory: Normal respiratory effort, no problems with respiration noted  Abdomen: Soft, gravid, appropriate for gestational age.        Pelvic: Cervical exam deferred        Extremities: Normal range of motion.     Mental Status: Normal mood and affect. Normal behavior. Normal judgment and thought content.   Assessment and Plan:  Pregnancy: [id number] at [redacted]w[redacted]d  1. Supervision of high risk pregnancy, antepartum GDM  2. Gestational diabetes mellitus (GDM) in third trimester controlled on oral hypoglycemic drug FBS 110 x 1 this week O/W all nl  3. Hypothyroidism, unspecified type Nl TSH  Preterm labor symptoms and general obstetric precautions including but not limited to vaginal bleeding, contractions, leaking of fluid and fetal movement were reviewed  in detail with the patient. Please refer to After Visit Summary for other counseling recommendations.  Return in about 1 week (around 08/01/2018) for NST/BPP and HOB weekly, UDIP @ each visit. NST rx, BPP 10/0 No future appointments.  Scheryl Darter, MD

## 2018-07-25 NOTE — Progress Notes (Signed)

## 2018-07-25 NOTE — Patient Instructions (Signed)

## 2018-08-01 ENCOUNTER — Ambulatory Visit: Payer: Self-pay

## 2018-08-01 ENCOUNTER — Ambulatory Visit (INDEPENDENT_AMBULATORY_CARE_PROVIDER_SITE_OTHER): Payer: Medicaid Other | Admitting: *Deleted

## 2018-08-01 ENCOUNTER — Ambulatory Visit (INDEPENDENT_AMBULATORY_CARE_PROVIDER_SITE_OTHER): Payer: Medicaid Other | Admitting: Obstetrics & Gynecology

## 2018-08-01 VITALS — BP 115/72 | HR 90 | Wt 157.0 lb

## 2018-08-01 DIAGNOSIS — O24415 Gestational diabetes mellitus in pregnancy, controlled by oral hypoglycemic drugs: Secondary | ICD-10-CM | POA: Diagnosis present

## 2018-08-01 DIAGNOSIS — O0993 Supervision of high risk pregnancy, unspecified, third trimester: Secondary | ICD-10-CM

## 2018-08-01 DIAGNOSIS — E038 Other specified hypothyroidism: Secondary | ICD-10-CM

## 2018-08-01 DIAGNOSIS — O368390 Maternal care for abnormalities of the fetal heart rate or rhythm, unspecified trimester, not applicable or unspecified: Secondary | ICD-10-CM

## 2018-08-01 DIAGNOSIS — O0990 Supervision of high risk pregnancy, unspecified, unspecified trimester: Secondary | ICD-10-CM

## 2018-08-01 LAB — POCT URINALYSIS DIP (DEVICE)
BILIRUBIN URINE: NEGATIVE
GLUCOSE, UA: NEGATIVE mg/dL
Hgb urine dipstick: NEGATIVE
KETONES UR: NEGATIVE mg/dL
LEUKOCYTES UA: NEGATIVE
NITRITE: NEGATIVE
PH: 6.5 (ref 5.0–8.0)
Protein, ur: NEGATIVE mg/dL
Specific Gravity, Urine: 1.02 (ref 1.005–1.030)
Urobilinogen, UA: 0.2 mg/dL (ref 0.0–1.0)

## 2018-08-01 MED ORDER — LEVOTHYROXINE SODIUM 50 MCG PO TABS: 100.0000 ug | ORAL_TABLET | Freq: Every day | ORAL | 2 refills | Status: DC

## 2018-08-01 NOTE — Progress Notes (Signed)
   PRENATAL VISIT NOTE  Subjective:  Ashley Rhodes is a 32 y.o. [phone] at [redacted]w[redacted]d being seen today for ongoing prenatal care.  She is currently monitored for the following issues for this high-risk pregnancy and has Uterine synechiae; Gestational diabetes mellitus (GDM) controlled on oral hypoglycemic drug; Hypothyroidism; History of cesarean delivery; History of gestational diabetes; Supervision of high risk pregnancy, antepartum; Meralgia paraesthetica, left; Overweight with body mass index (BMI) 25.0-29.9; and Secondary female infertility on their problem list.  Patient reports occasional contractions.  Contractions: Irregular. Vag. Bleeding: None.  Movement: Present. Denies leaking of fluid.   The following portions of the patient's history were reviewed and updated as appropriate: allergies, current medications, past family history, past medical history, past social history, past surgical history and problem list. Problem list updated.  Objective:   Vitals:   08/01/18 0919  BP: 115/72  Pulse: 90  Weight: 157 lb (71.2 kg)    Fetal Status: Fetal Heart Rate (bpm): 140   Movement: Present     General:  Alert, oriented and cooperative. Patient is in no acute distress.  Skin: Skin is warm and dry. No rash noted.   Cardiovascular: Normal heart rate noted  Respiratory: Normal respiratory effort, no problems with respiration noted  Abdomen: Soft, gravid, appropriate for gestational age.  Pain/Pressure: Present     Pelvic: Cervical exam deferred        Extremities: Normal range of motion.  Edema: None  Mental Status: Normal mood and affect. Normal behavior. Normal judgment and thought content.   Assessment and Plan:  Pregnancy: [id number] at [redacted]w[redacted]d  1. Gestational diabetes mellitus (GDM) in second trimester controlled on oral hypoglycemic drug FBS and PP are within range  2. Supervision of high risk pregnancy, antepartum Fetal testing today  3. Other specified  hypothyroidism Refill RX - levothyroxine (SYNTHROID) 50 MCG tablet; Take 2 tablets (100 mcg total) by mouth daily.  Dispense: 30 tablet; Refill: 2  Preterm labor symptoms and general obstetric precautions including but not limited to vaginal bleeding, contractions, leaking of fluid and fetal movement were reviewed in detail with the patient. Please refer to After Visit Summary for other counseling recommendations.  Return in about 1 week (around 08/08/2018).  Future Appointments  Date Time Provider Department Center  08/01/2018 10:15 AM [facility] NST [facility]  08/08/2018  9:15 AM [facility] NST [facility]OC  08/08/2018 10:15 AM Hermina Staggers, MD [facility]    Scheryl Darter, MD

## 2018-08-01 NOTE — Addendum Note (Signed)
Addended by: Jill Side on: 08/01/2018 04:20 PM   Modules accepted: Orders

## 2018-08-01 NOTE — Progress Notes (Signed)
Pt informed that the ultrasound is considered a limited OB ultrasound and is not intended to be a complete ultrasound exam.  Patient also informed that the ultrasound is not being completed with the intent of assessing for fetal or placental anomalies or any pelvic abnormalities.  Explained that the purpose of today's ultrasound is to assess for presentation, BPP and amniotic fluid volume.  Patient acknowledges the purpose of the exam and the limitations of the study.  Maternal position changed twice during NST due to FHR variables. NST tracing reviewed by Dr. Debroah Loop. Pt to return later today for repeat NST.

## 2018-08-01 NOTE — Progress Notes (Signed)
Platelet count noted, CBC ordered

## 2018-08-01 NOTE — Progress Notes (Signed)
Prolonged NST x70 minutes. Occasional variables present during initial 30 minutes, then resolved for last 40 minutes. Tracing reviewed by Dr. Debroah Loop - recommends pt have NST on 10/10.  Pt informed of plan of care and states she is not able to come to hospital for appt on 10/10 due to no child care. She agrees to return on 10/11 @ 0915 for NST.

## 2018-08-01 NOTE — Patient Instructions (Signed)

## 2018-08-01 NOTE — Progress Notes (Signed)
C/o a lot of contractions- maybe every hour and worse if she moves around. Maybe uc's every 10-15 minutes.

## 2018-08-02 LAB — CBC
Hematocrit: 35.8 % (ref 34.0–46.6)
Hemoglobin: 11.7 g/dL (ref 11.1–15.9)
MCH: 27.9 pg (ref 26.6–33.0)
MCHC: 32.7 g/dL (ref 31.5–35.7)
MCV: 85 fL (ref 79–97)
PLATELETS: 138 10*3/uL — AB (ref 150–450)
RBC: 4.2 x10E6/uL (ref 3.77–5.28)
RDW: 14.7 % (ref 12.3–15.4)
WBC: 7 10*3/uL (ref 3.4–10.8)

## 2018-08-05 ENCOUNTER — Ambulatory Visit (INDEPENDENT_AMBULATORY_CARE_PROVIDER_SITE_OTHER): Payer: Medicaid Other | Admitting: *Deleted

## 2018-08-05 VITALS — BP 113/69 | HR 84 | Wt 157.4 lb

## 2018-08-05 DIAGNOSIS — O24415 Gestational diabetes mellitus in pregnancy, controlled by oral hypoglycemic drugs: Secondary | ICD-10-CM | POA: Diagnosis present

## 2018-08-05 NOTE — Progress Notes (Signed)
NST today due to history of variable FHR decels on 10/8. Reactive NST with no decels.

## 2018-08-08 ENCOUNTER — Ambulatory Visit (INDEPENDENT_AMBULATORY_CARE_PROVIDER_SITE_OTHER): Payer: Medicaid Other | Admitting: *Deleted

## 2018-08-08 ENCOUNTER — Ambulatory Visit (INDEPENDENT_AMBULATORY_CARE_PROVIDER_SITE_OTHER): Payer: Medicaid Other | Admitting: Obstetrics and Gynecology

## 2018-08-08 ENCOUNTER — Encounter: Payer: Self-pay | Admitting: Obstetrics and Gynecology

## 2018-08-08 ENCOUNTER — Ambulatory Visit: Payer: Self-pay

## 2018-08-08 VITALS — BP 108/66 | HR 81 | Wt 158.4 lb

## 2018-08-08 DIAGNOSIS — O24415 Gestational diabetes mellitus in pregnancy, controlled by oral hypoglycemic drugs: Secondary | ICD-10-CM

## 2018-08-08 DIAGNOSIS — Z98891 History of uterine scar from previous surgery: Secondary | ICD-10-CM

## 2018-08-08 DIAGNOSIS — E039 Hypothyroidism, unspecified: Secondary | ICD-10-CM

## 2018-08-08 DIAGNOSIS — O0990 Supervision of high risk pregnancy, unspecified, unspecified trimester: Secondary | ICD-10-CM

## 2018-08-08 LAB — POCT URINALYSIS DIP (DEVICE)
BILIRUBIN URINE: NEGATIVE
Glucose, UA: NEGATIVE mg/dL
HGB URINE DIPSTICK: NEGATIVE
KETONES UR: NEGATIVE mg/dL
LEUKOCYTES UA: NEGATIVE
Nitrite: NEGATIVE
PH: 7 (ref 5.0–8.0)
Protein, ur: NEGATIVE mg/dL
SPECIFIC GRAVITY, URINE: 1.02 (ref 1.005–1.030)
Urobilinogen, UA: 0.2 mg/dL (ref 0.0–1.0)

## 2018-08-08 NOTE — Progress Notes (Signed)
[place] for growth and BPP scheduled on 10/21

## 2018-08-08 NOTE — Progress Notes (Signed)
Subjective:  Ashley Rhodes is a 33 y.o. [id number] at [redacted]w[redacted]d being seen today for ongoing prenatal care.  She is currently monitored for the following issues for this high-risk pregnancy and has Uterine synechiae; Gestational diabetes mellitus (GDM) controlled on oral hypoglycemic drug; Hypothyroidism; History of cesarean delivery; History of gestational diabetes; Supervision of high risk pregnancy, antepartum; Meralgia paraesthetica, left; Overweight with body mass index (BMI) 25.0-29.9; and Secondary female infertility on their problem list.  Patient reports general discomforts of pregnancy.  Contractions: Irregular. Vag. Bleeding: None.  Movement: Present. Denies leaking of fluid.   The following portions of the patient's history were reviewed and updated as appropriate: allergies, current medications, past family history, past medical history, past social history, past surgical history and problem list. Problem list updated.  Objective:   Vitals:   08/08/18 0932  BP: 108/66  Pulse: 81  Weight: 158 lb 6.4 oz (71.8 kg)    Fetal Status: Fetal Heart Rate (bpm): NST   Movement: Present     General:  Alert, oriented and cooperative. Patient is in no acute distress.  Skin: Skin is warm and dry. No rash noted.   Cardiovascular: Normal heart rate noted  Respiratory: Normal respiratory effort, no problems with respiration noted  Abdomen: Soft, gravid, appropriate for gestational age. Pain/Pressure: Present     Pelvic:  Cervical exam deferred        Extremities: Normal range of motion.     Mental Status: Normal mood and affect. Normal behavior. Normal judgment and thought content.   Urinalysis:      Assessment and Plan:  Pregnancy: [id number] at [redacted]w[redacted]d  1. Supervision of high risk pregnancy, antepartum Stable  2. Gestational diabetes mellitus (GDM) in third trimester controlled on oral hypoglycemic drug CBG's in goal range BPP 10/10 today Growth scan next week Continue with weekly  antenatal testing and Metformin  3. Hypothyroidism, unspecified type Last TSH normal  4. History of cesarean delivery For repeat on 09/10/18  Preterm labor symptoms and general obstetric precautions including but not limited to vaginal bleeding, contractions, leaking of fluid and fetal movement were reviewed in detail with the patient. Please refer to After Visit Summary for other counseling recommendations.  Return in about 2 weeks (around 08/22/2018) for [facility] and NST/BPP.   Hermina Staggers, MD

## 2018-08-08 NOTE — Progress Notes (Signed)

## 2018-08-09 ENCOUNTER — Other Ambulatory Visit: Payer: Self-pay | Admitting: Family Medicine

## 2018-08-09 DIAGNOSIS — O24410 Gestational diabetes mellitus in pregnancy, diet controlled: Secondary | ICD-10-CM

## 2018-08-15 ENCOUNTER — Encounter (HOSPITAL_COMMUNITY): Payer: Self-pay

## 2018-08-15 ENCOUNTER — Ambulatory Visit: Payer: Medicaid Other | Admitting: *Deleted

## 2018-08-15 ENCOUNTER — Ambulatory Visit (HOSPITAL_COMMUNITY)
Admission: RE | Admit: 2018-08-15 | Discharge: 2018-08-15 | Disposition: A | Discharge status: Home / Self Care | Payer: Medicaid Other | Source: Ambulatory Visit | Attending: Obstetrics & Gynecology | Admitting: Obstetrics & Gynecology

## 2018-08-15 VITALS — BP 115/76 | HR 80 | Wt 156.2 lb

## 2018-08-15 DIAGNOSIS — O24415 Gestational diabetes mellitus in pregnancy, controlled by oral hypoglycemic drugs: Secondary | ICD-10-CM | POA: Insufficient documentation

## 2018-08-15 DIAGNOSIS — O34219 Maternal care for unspecified type scar from previous cesarean delivery: Secondary | ICD-10-CM | POA: Diagnosis not present

## 2018-08-15 DIAGNOSIS — O09293 Supervision of pregnancy with other poor reproductive or obstetric history, third trimester: Secondary | ICD-10-CM | POA: Diagnosis not present

## 2018-08-15 DIAGNOSIS — O0993 Supervision of high risk pregnancy, unspecified, third trimester: Secondary | ICD-10-CM | POA: Diagnosis not present

## 2018-08-15 DIAGNOSIS — Z3A35 35 weeks gestation of pregnancy: Secondary | ICD-10-CM

## 2018-08-15 DIAGNOSIS — E038 Other specified hypothyroidism: Secondary | ICD-10-CM | POA: Diagnosis not present

## 2018-08-15 DIAGNOSIS — O99283 Endocrine, nutritional and metabolic diseases complicating pregnancy, third trimester: Secondary | ICD-10-CM | POA: Diagnosis not present

## 2018-08-15 DIAGNOSIS — O0990 Supervision of high risk pregnancy, unspecified, unspecified trimester: Secondary | ICD-10-CM

## 2018-08-15 NOTE — Progress Notes (Signed)
[place] for growth and BPP (8/8) done @ [facility] today. EFW - 54%, AFI - 24.44 cm (93%).

## 2018-08-16 ENCOUNTER — Other Ambulatory Visit: Payer: Self-pay | Admitting: Obstetrics & Gynecology

## 2018-08-16 NOTE — Progress Notes (Signed)
RCS 11.16

## 2018-08-22 ENCOUNTER — Ambulatory Visit (INDEPENDENT_AMBULATORY_CARE_PROVIDER_SITE_OTHER): Payer: Medicaid Other | Admitting: *Deleted

## 2018-08-22 ENCOUNTER — Other Ambulatory Visit (HOSPITAL_COMMUNITY)
Admission: RE | Admit: 2018-08-22 | Discharge: 2018-08-22 | Disposition: A | Discharge status: Home / Self Care | Payer: Medicaid Other | Source: Ambulatory Visit | Attending: Family Medicine | Admitting: Family Medicine

## 2018-08-22 ENCOUNTER — Ambulatory Visit: Payer: Self-pay

## 2018-08-22 ENCOUNTER — Ambulatory Visit (INDEPENDENT_AMBULATORY_CARE_PROVIDER_SITE_OTHER): Payer: Medicaid Other | Admitting: Family Medicine

## 2018-08-22 VITALS — BP 119/74 | HR 81 | Wt 157.7 lb

## 2018-08-22 DIAGNOSIS — O0990 Supervision of high risk pregnancy, unspecified, unspecified trimester: Secondary | ICD-10-CM | POA: Insufficient documentation

## 2018-08-22 DIAGNOSIS — O24415 Gestational diabetes mellitus in pregnancy, controlled by oral hypoglycemic drugs: Secondary | ICD-10-CM

## 2018-08-22 DIAGNOSIS — Z98891 History of uterine scar from previous surgery: Secondary | ICD-10-CM

## 2018-08-22 DIAGNOSIS — E039 Hypothyroidism, unspecified: Secondary | ICD-10-CM

## 2018-08-22 LAB — POCT URINALYSIS DIP (DEVICE)
Bilirubin Urine: NEGATIVE
Glucose, UA: NEGATIVE mg/dL
Ketones, ur: NEGATIVE mg/dL
Nitrite: NEGATIVE
PH: 7 (ref 5.0–8.0)
PROTEIN: NEGATIVE mg/dL
Specific Gravity, Urine: 1.02 (ref 1.005–1.030)
UROBILINOGEN UA: 0.2 mg/dL (ref 0.0–1.0)

## 2018-08-22 NOTE — Progress Notes (Signed)

## 2018-08-22 NOTE — Progress Notes (Signed)
Subjective:  Ashley Rhodes is a 32 y.o. [phone] at [redacted]w[redacted]d being seen today for ongoing prenatal care.  She is currently monitored for the following issues for this high-risk pregnancy and has Uterine synechiae; Gestational diabetes mellitus (GDM) controlled on oral hypoglycemic drug; Hypothyroidism; History of cesarean delivery; History of gestational diabetes; Supervision of high risk pregnancy, antepartum; Meralgia paraesthetica, left; Overweight with body mass index (BMI) 25.0-29.9; and Secondary female infertility on their problem list.  GDM: Patient taking metformin 500mg  BID.  Reports 1-2 hypoglycemic episodes.  Tolerating medication well Fasting: mostly controlled - 1-2 elevated 2hr PP: 8 elevated between 120-130.  Patient reports no complaints.  Contractions: Irregular. Vag. Bleeding: None.  Movement: Present. Denies leaking of fluid.   The following portions of the patient's history were reviewed and updated as appropriate: allergies, current medications, past family history, past medical history, past social history, past surgical history and problem list. Problem list updated.  Objective:   Vitals:   08/22/18 0833  BP: 119/74  Pulse: 81  Weight: 157 lb 11.2 oz (71.5 kg)    Fetal Status: Fetal Heart Rate (bpm): NST   Movement: Present     General:  Alert, oriented and cooperative. Patient is in no acute distress.  Skin: Skin is warm and dry. No rash noted.   Cardiovascular: Normal heart rate noted  Respiratory: Normal respiratory effort, no problems with respiration noted  Abdomen: Soft, gravid, appropriate for gestational age. Pain/Pressure: Present     Pelvic: Vag. Bleeding: None     Cervical exam deferred        Extremities: Normal range of motion.     Mental Status: Normal mood and affect. Normal behavior. Normal judgment and thought content.   Urinalysis:      Assessment and Plan:  Pregnancy: [id number] at [redacted]w[redacted]d  1. Supervision of high risk pregnancy,  antepartum FHT and FH normal - GC/Chlamydia probe amp ([place])not at [facility] - Strep Gp B NAA  2. Gestational diabetes mellitus (GDM) in third trimester controlled on oral hypoglycemic drug Will continue to watch CBGs closely for now. No changes to regimen. BPP 10/10 today Last growth [place] 10/21 at 54% (2546g) Continue weekly antenatal testing.  3. Hypothyroidism, unspecified type Continue synthroid daily  4. History of cesarean delivery Scheduled for rpt at 39 weeks  Preterm labor symptoms and general obstetric precautions including but not limited to vaginal bleeding, contractions, leaking of fluid and fetal movement were reviewed in detail with the patient. Please refer to After Visit Summary for other counseling recommendations.  Return in about 1 week (around 08/29/2018) for as scheduled.   Levie Heritage, DO

## 2018-08-22 NOTE — Progress Notes (Signed)
[place] for growth done on 10/21.  Repeat C/S scheduled on 11/16.

## 2018-08-23 LAB — GC/CHLAMYDIA PROBE AMP (~~LOC~~) NOT AT ARMC
Chlamydia: NEGATIVE
Neisseria Gonorrhea: NEGATIVE

## 2018-08-25 ENCOUNTER — Telehealth (HOSPITAL_COMMUNITY): Payer: Self-pay | Admitting: *Deleted

## 2018-08-25 NOTE — Telephone Encounter (Signed)
Preadmission screen  

## 2018-08-29 ENCOUNTER — Ambulatory Visit (INDEPENDENT_AMBULATORY_CARE_PROVIDER_SITE_OTHER): Payer: Medicaid Other | Admitting: *Deleted

## 2018-08-29 ENCOUNTER — Ambulatory Visit: Payer: Self-pay

## 2018-08-29 ENCOUNTER — Ambulatory Visit (INDEPENDENT_AMBULATORY_CARE_PROVIDER_SITE_OTHER): Payer: Medicaid Other | Admitting: Obstetrics & Gynecology

## 2018-08-29 VITALS — BP 109/66 | HR 85 | Wt 156.4 lb

## 2018-08-29 DIAGNOSIS — Z8632 Personal history of gestational diabetes: Secondary | ICD-10-CM

## 2018-08-29 DIAGNOSIS — O0990 Supervision of high risk pregnancy, unspecified, unspecified trimester: Secondary | ICD-10-CM

## 2018-08-29 DIAGNOSIS — Z98891 History of uterine scar from previous surgery: Secondary | ICD-10-CM

## 2018-08-29 DIAGNOSIS — E039 Hypothyroidism, unspecified: Secondary | ICD-10-CM

## 2018-08-29 DIAGNOSIS — O24415 Gestational diabetes mellitus in pregnancy, controlled by oral hypoglycemic drugs: Secondary | ICD-10-CM

## 2018-08-29 DIAGNOSIS — O24410 Gestational diabetes mellitus in pregnancy, diet controlled: Secondary | ICD-10-CM

## 2018-08-29 DIAGNOSIS — O99283 Endocrine, nutritional and metabolic diseases complicating pregnancy, third trimester: Secondary | ICD-10-CM

## 2018-08-29 LAB — STREP GP B NAA: Strep Gp B NAA: NEGATIVE

## 2018-08-29 MED ORDER — GLUCOSE BLOOD VI STRP: ORAL_STRIP | 12 refills | Status: DC

## 2018-08-29 NOTE — Progress Notes (Signed)

## 2018-08-29 NOTE — Progress Notes (Signed)
Pt reports decreased FM x5 days. Rpt C/S scheduled on 11/16

## 2018-08-29 NOTE — Progress Notes (Signed)
PRENATAL VISIT NOTE  Subjective:  Ashley Rhodes is a 32 y.o. [phone] at [redacted]w[redacted]d being seen today for ongoing prenatal care.  She is currently monitored for the following issues for this high-risk pregnancy and has Uterine synechiae; Gestational diabetes mellitus (GDM) controlled on oral hypoglycemic drug; Hypothyroidism; History of cesarean delivery; History of gestational diabetes; Supervision of high risk pregnancy, antepartum; Meralgia paraesthetica, left; Overweight with body mass index (BMI) 25.0-29.9; and Secondary female infertility on their problem list.  Patient reports slightly decreased FM for five days.  Contractions: Irregular. Vag. Bleeding: None.  Movement: (!) Decreased. Denies leaking of fluid.   The following portions of the patient's history were reviewed and updated as appropriate: allergies, current medications, past family history, past medical history, past social history, past surgical history and problem list. Problem list updated.  Objective:   Vitals:   08/29/18 0839  BP: 109/66  Pulse: 85  Weight: 156 lb 6.4 oz (70.9 kg)    Fetal Status: Fetal Heart Rate (bpm): NST   Movement: (!) Decreased     General:  Alert, oriented and cooperative. Patient is in no acute distress.  Skin: Skin is warm and dry. No rash noted.   Cardiovascular: Normal heart rate noted  Respiratory: Normal respiratory effort, no problems with respiration noted  Abdomen: Soft, gravid, appropriate for gestational age.  Pain/Pressure: Present     Pelvic: Cervical exam deferred        Extremities: Normal range of motion.     Mental Status: Normal mood and affect. Normal behavior. Normal judgment and thought content.    [place] Mfm Fetal Bpp Wo Non Stress  Result Date: 08/15/2018 ----------------------------------------------------------------------  OBSTETRICS REPORT                       (Signed Final 08/15/2018 09:07 am)  ---------------------------------------------------------------------- Patient Info  ID #:       [id number]                          D.O.B.:  12/05/1985 (32 yrs)  Name:       Ashley Rhodes                Visit Date: 08/15/2018 08:30 am              KHANAL ---------------------------------------------------------------------- Performed By  Performed By:     Marcellina Millin          Ref. Address:     [facility]                    RDMS                                                             [facility]Gyn Clinic                                                             [address]                                                             [place], [place]                                                             [address]  Attending:        Patsi Sears      Location:         [facility]                    MD  Referred By:      [facility] for                    [facility]                    Healthcare ---------------------------------------------------------------------- Orders   #  Description                          Code         Ordered By   1  [place] MFM OB FOLLOW UP                  [phone]     Scheryl Darter   2  [place] MFM FETAL BPP WO NON              81191.47     [facility]      STRESS  ----------------------------------------------------------------------   #  Order #                    Accession #                 Episode #   1  [id number]                  [id number]                  [id number]   2  [id number]                  [id number]                  [id number]  ---------------------------------------------------------------------- Indications   [redacted] weeks gestation of pregnancy                Z3A.35   Gestational diabetes in pregnancy,             O24.415   controlled by oral hypoglycemic drugs   Hypothyroid                                    O99.280 E03.9   Poor obstetric history: Previous gestational   O09.299    diabetes   Previous cesarean delivery, antepartum  O34.219  ---------------------------------------------------------------------- Fetal Evaluation  Num Of Fetuses:         1  Fetal Heart Rate(bpm):  154  Cardiac Activity:       Observed  Presentation:           Cephalic  Placenta:               Posterior  P. Cord Insertion:      Previously Visualized  Amniotic Fluid  AFI FV:      Subjectively upper-normal  AFI Sum(cm)     %Tile       Largest Pocket(cm)  24.44           93          9.33  RUQ(cm)       RLQ(cm)       LUQ(cm)        LLQ(cm)  9.33          4.52          4.91           5.68 ---------------------------------------------------------------------- Biophysical Evaluation  Amniotic F.V:   Within normal limits       F. Tone:        Observed  F. Movement:    Observed                   Score:          8/8  F. Breathing:   Observed ---------------------------------------------------------------------- Biometry  BPD:      87.7  mm     G. Age:  35w 3d         58  %    CI:        78.85   %    70 - 86                                                          FL/HC:      20.9   %    20.1 - 22.3  HC:      312.3  mm     G. Age:  35w 0d         14  %    HC/AC:      0.99        0.93 - 1.11  AC:       315   mm     G. Age:  35w 3d         62  %    FL/BPD:     74.5   %    71 - 87  FL:       65.3  mm     G. Age:  33w 5d         10  %    FL/AC:      20.7   %    20 - 24  Est. FW:    2546  gm    5 lb 10 oz      54  % ---------------------------------------------------------------------- OB History  Gravidity:    6         Term:   1        Prem:   0        SAB:   4  TOP:  0       Ectopic:  0        Living: 1 ---------------------------------------------------------------------- Gestational Age  LMP:           35w 2d        Date:  12/11/17                 EDD:   09/17/18  U/S Today:     34w 6d                                        EDD:   09/20/18  Best:          35w 2d     Det. By:  LMP  (12/11/17)          EDD:    09/17/18 ---------------------------------------------------------------------- Anatomy  Cranium:               Appears normal         Aortic Arch:            Previously seen  Cavum:                 Previously seen        Ductal Arch:            Previously seen  Ventricles:            Appears normal         Diaphragm:              Appears normal  Choroid Plexus:        Previously seen        Stomach:                Appears normal, left                                                                        sided  Cerebellum:            Previously seen        Abdomen:                Appears normal  Posterior Fossa:       Previously seen        Abdominal Wall:         Previously seen  Nuchal Fold:           Previously seen        Cord Vessels:           Previously seen  Face:                  Orbits and profile     Kidneys:                Appear normal                         previously seen  Lips:                  Previously seen        Bladder:  Appears normal  Thoracic:              Appears normal         Spine:                  Appears normal  Heart:                 Previously seen        Upper Extremities:      Previously seen  RVOT:                  Appears normal         Lower Extremities:      Previously seen  LVOT:                  Appears normal  Other:  Fetus appears to be a female. Heels and 5th digit previously visualized.          Nasal bone previously  visualized. Open hands previously visualized. ---------------------------------------------------------------------- Cervix Uterus Adnexa  Cervix  Not visualized (advanced GA >24wks) ---------------------------------------------------------------------- Comments  U/S images reviewed. Findings reviewed with patient.  Appropriate fetal growth is noted.  No evidence of fetal  compromise is found on BPP today.  No fetal abnormalities  are seen.  Patient wants to change her Metformin regimen because of  frequent hypoglycemic episodes.  I reviewed  her glucose log  for the past 2 weeks and she had 2 PP breakfast levels in the  60's.  She takes Metformin with Lunch and Dinner only.  I  reviewed dietary management of symptomatic hypoglycemia  and discouraged any alterations in her diabetic management  without discussing it with her physician.  Questions answered.  10 minutes spent face to face with patient.  Recommendations: 1) Close A-P surveillance in Faculty  Clinic 2) Follow-up growth in 4 weeks if not delivered (not  scheduled) ---------------------------------------------------------------------- Recommendations  1) Close A-P surveillance in Faculty Clinic 2) Follow-up  growth in 4 weeks if not delivered (not scheduled) ----------------------------------------------------------------------               Patsi Sears, MD Electronically Signed Final Report   08/15/2018 09:07 am ----------------------------------------------------------------------  [place] Mfm Ob Follow Up  Result Date: 08/15/2018 ----------------------------------------------------------------------  OBSTETRICS REPORT                       (Signed Final 08/15/2018 09:07 am) ---------------------------------------------------------------------- Patient Info  ID #:       [id number]                          D.O.B.:  05/29/1986 (32 yrs)  Name:       Ashley Rhodes                Visit Date: 08/15/2018 08:30 am              KHANAL ---------------------------------------------------------------------- Performed By  Performed By:     Marcellina Millin          Ref. Address:     [facility]                    RDMS  OB[facility] Clinic                                                             [address]                                                             [place], [place]                                                             [address]  Attending:        Patsi Sears      [facility]                    MD  Referred By:      [facility] for                    [facility]                    Healthcare ---------------------------------------------------------------------- Orders   #  Description                          Code         Ordered By   1  [place] MFM OB FOLLOW UP                  [phone]     Scheryl Darter   2  [place] MFM FETAL BPP WO NON              81191.47     [facility]      STRESS  ----------------------------------------------------------------------   #  Order #                    Accession #                 Episode #   1  [id number]                  [id number]                  [id number]   2  [id number]                  [id number]  [id number]  ---------------------------------------------------------------------- Indications   [redacted] weeks gestation of pregnancy                Z3A.35   Gestational diabetes in pregnancy,             O24.415   controlled by oral hypoglycemic drugs   Hypothyroid                                    O99.280 E03.9   Poor obstetric history: Previous gestational   O09.299   diabetes   Previous cesarean delivery, antepartum         O34.219  ---------------------------------------------------------------------- Fetal Evaluation  Num Of Fetuses:         1  Fetal Heart Rate(bpm):  154  Cardiac Activity:       Observed  Presentation:           Cephalic  Placenta:               Posterior  P. Cord Insertion:      Previously Visualized  Amniotic Fluid  AFI FV:      Subjectively upper-normal  AFI Sum(cm)     %Tile       Largest Pocket(cm)  24.44           93          9.33  RUQ(cm)       RLQ(cm)       LUQ(cm)        LLQ(cm)  9.33          4.52          4.91           5.68 ---------------------------------------------------------------------- Biophysical Evaluation  Amniotic F.V:   Within normal limits       F. Tone:        Observed  F. Movement:    Observed                   Score:           8/8  F. Breathing:   Observed ---------------------------------------------------------------------- Biometry  BPD:      87.7  mm     G. Age:  35w 3d         58  %    CI:        78.85   %    70 - 86                                                          FL/HC:      20.9   %    20.1 - 22.3  HC:      312.3  mm     G. Age:  35w 0d         14  %    HC/AC:      0.99        0.93 - 1.11  AC:       315   mm     G. Age:  35w 3d         62  %    FL/BPD:     74.5   %    71 - 87  FL:       65.3  mm     G. Age:  33w 5d         10  %    FL/AC:      20.7   %    20 - 24  Est. FW:    2546  gm    5 lb 10 oz      54  % ---------------------------------------------------------------------- OB History  Gravidity:    6         Term:   1        Prem:   0        SAB:   4  TOP:          0       Ectopic:  0        Living: 1 ---------------------------------------------------------------------- Gestational Age  LMP:           35w 2d        Date:  12/11/17                 EDD:   09/17/18  U/S Today:     34w 6d                                        EDD:   09/20/18  Best:          35w 2d     Det. By:  LMP  (12/11/17)          EDD:   09/17/18 ---------------------------------------------------------------------- Anatomy  Cranium:               Appears normal         Aortic Arch:            Previously seen  Cavum:                 Previously seen        Ductal Arch:            Previously seen  Ventricles:            Appears normal         Diaphragm:              Appears normal  Choroid Plexus:        Previously seen        Stomach:                Appears normal, left                                                                        sided  Cerebellum:            Previously seen        Abdomen:                Appears normal  Posterior Fossa:       Previously seen        Abdominal Wall:         Previously seen  Nuchal Fold:           Previously seen  Cord Vessels:           Previously seen  Face:                  Orbits and profile      Kidneys:                Appear normal                         previously seen  Lips:                  Previously seen        Bladder:                Appears normal  Thoracic:              Appears normal         Spine:                  Appears normal  Heart:                 Previously seen        Upper Extremities:      Previously seen  RVOT:                  Appears normal         Lower Extremities:      Previously seen  LVOT:                  Appears normal  Other:  Fetus appears to be a female. Heels and 5th digit previously visualized.          Nasal bone previously  visualized. Open hands previously visualized. ---------------------------------------------------------------------- Cervix Uterus Adnexa  Cervix  Not visualized (advanced GA >24wks) ---------------------------------------------------------------------- Comments  U/S images reviewed. Findings reviewed with patient.  Appropriate fetal growth is noted.  No evidence of fetal  compromise is found on BPP today.  No fetal abnormalities  are seen.  Patient wants to change her Metformin regimen because of  frequent hypoglycemic episodes.  I reviewed her glucose log  for the past 2 weeks and she had 2 PP breakfast levels in the  60's.  She takes Metformin with Lunch and Dinner only.  I  reviewed dietary management of symptomatic hypoglycemia  and discouraged any alterations in her diabetic management  without discussing it with her physician.  Questions answered.  10 minutes spent face to face with patient.  Recommendations: 1) Close A-P surveillance in Faculty  Clinic 2) Follow-up growth in 4 weeks if not delivered (not  scheduled) ---------------------------------------------------------------------- Recommendations  1) Close A-P surveillance in Faculty Clinic 2) Follow-up  growth in 4 weeks if not delivered (not scheduled) ----------------------------------------------------------------------               Patsi Sears, MD Electronically Signed Final  Report   08/15/2018 09:07 am ----------------------------------------------------------------------  US Fetal Bpp W/nonstress  Result Date: 08/22/2018 ----------------------------------------------------------------------  OBSTETRICS REPORT                       (Signed Final 08/22/2018 09:29 am) ---------------------------------------------------------------------- Patient Info  ID #:       [id number]                          D.O.B.:  1986-02-16 (32 yrs)  Name:       Ashley Rhodes  POKHAREL                Visit Date: 08/22/2018 09:19 am              [facility] ---------------------------------------------------------------------- Performed By  Performed By:     Sedalia Muta Day RNC          Ref. Address:     [facility]                                                             [address]                                                             [place], [place]                                                             [address]  Attending:        Candelaria Celeste DO       Location:         Center for                                                             [facility]  Referred By:      [facility] for                    [facility]                    Healthcare ---------------------------------------------------------------------- Orders   #  Description                          Code         Ordered By   1  US FETAL BPP W/NONSTRESS             [address].6      Candelaria Celeste  ----------------------------------------------------------------------   #  Order #                    Accession #                 Episode #   1  [id number]                  [id number]                  [id number]  ---------------------------------------------------------------------- Service(s) Provided   US Fetal BPP  W NST                                   [phone]  ---------------------------------------------------------------------- Indications   [redacted] weeks gestation of pregnancy                Z3A.36   Gestational diabetes in pregnancy,             O24.415   controlled by oral hypoglycemic drugs  ---------------------------------------------------------------------- Fetal Evaluation  Num Of Fetuses:         1  Preg. Location:         Intrauterine  Cardiac Activity:       Observed  Presentation:           Cephalic  Amniotic Fluid  AFI FV:      Within normal limits  AFI Sum(cm)     %Tile       Largest Pocket(cm)  14.75           54          6.3  RUQ(cm)       RLQ(cm)       LUQ(cm)        LLQ(cm)  2.89          0             6.3            5.56 ---------------------------------------------------------------------- Biophysical Evaluation  Amniotic F.V:   Pocket => 2 cm two         F. Tone:        Observed                  planes  F. Movement:    Observed                   N.S.T:          Reactive  F. Breathing:   Observed                   Score:          10/10 ---------------------------------------------------------------------- OB History  Gravidity:    6         Term:   1        Prem:   0        SAB:   4  TOP:          0       Ectopic:  0        Living: 1 ---------------------------------------------------------------------- Gestational Age  LMP:           36w 2d        Date:  12/11/17                 EDD:   09/17/18  Best:          Stevie Kern 2d     Det. By:  LMP  (12/11/17)          EDD:   09/17/18 ---------------------------------------------------------------------- Impression  BPP 10/10 ---------------------------------------------------------------------- Recommendations  Continue antenatal testing ----------------------------------------------------------------------                   Candelaria Celeste, DO Electronically Signed Final Report   08/22/2018 09:29 am  ----------------------------------------------------------------------   Assessment and Plan:  Pregnancy: [id number] at [redacted]w[redacted]d  1. Gestational diabetes mellitus (GDM) in third trimester controlled on oral hypoglycemic drug Lunch PP in 130s, only one abnormal fasting. Told to take Metformin in morning at night (was taking it at lunch and dinner). NST performed today was reviewed and was found to be reactive; fetal movement heard several times. Subsequent BPP performed today was also reviewed and was found to be 10/10. AFI was also normal. Continue recommended antenatal testing and prenatal care. - Hemoglobin A1c  2. Hypothyroid in pregnancy, antepartum, third trimester On Synthroid 100 mcg, will check labs today. Normal in 06/2018. - TSH - T3, free - T4, free  3. History of cesarean delivery Scheduled RCS at 11/16.  4. Supervision of high risk pregnancy, antepartum Term labor symptoms and general obstetric precautions including but not limited to vaginal bleeding, contractions, leaking of fluid and fetal movement were reviewed in detail with the patient. Strict fetal movement precautions advised. Please refer to After Visit Summary for other counseling recommendations.  Return in about 1 week (around 09/05/2018) for as scheduled.  Future Appointments  Date Time Provider Department Center  09/05/2018  9:15 AM Tamera Stands, DO [facility] WOC  09/05/2018 11:15 AM [facility] NST [facility] WOC  09/09/2018  8:30 AM [facility] PAT 5 [facility] None    Jaynie Collins, MD

## 2018-08-30 ENCOUNTER — Encounter (HOSPITAL_COMMUNITY): Payer: Self-pay

## 2018-08-30 LAB — HEMOGLOBIN A1C
ESTIMATED AVERAGE GLUCOSE: 108 mg/dL
Hgb A1c MFr Bld: 5.4 % (ref 4.8–5.6)

## 2018-08-30 LAB — T4, FREE: Free T4: 1.38 ng/dL (ref 0.82–1.77)

## 2018-08-30 LAB — T3, FREE: T3, Free: 2.4 pg/mL (ref 2.0–4.4)

## 2018-08-30 LAB — TSH: TSH: 1.77 u[IU]/mL (ref 0.450–4.500)

## 2018-09-04 ENCOUNTER — Encounter (HOSPITAL_COMMUNITY): Payer: Self-pay | Admitting: *Deleted

## 2018-09-04 ENCOUNTER — Inpatient Hospital Stay (HOSPITAL_COMMUNITY): Payer: Medicaid Other | Admitting: Anesthesiology

## 2018-09-04 ENCOUNTER — Encounter (HOSPITAL_COMMUNITY)
Admission: AD | Disposition: A | Discharge status: Home / Self Care | Payer: Self-pay | Source: Home / Self Care | Attending: Obstetrics and Gynecology

## 2018-09-04 ENCOUNTER — Inpatient Hospital Stay (HOSPITAL_COMMUNITY)
Admission: AD | Admit: 2018-09-04 | Discharge: 2018-09-06 | DRG: 788 | Disposition: A | Discharge status: Home / Self Care | Payer: Medicaid Other | Attending: Obstetrics and Gynecology | Admitting: Obstetrics and Gynecology

## 2018-09-04 DIAGNOSIS — O9902 Anemia complicating childbirth: Secondary | ICD-10-CM | POA: Diagnosis present

## 2018-09-04 DIAGNOSIS — O4202 Full-term premature rupture of membranes, onset of labor within 24 hours of rupture: Secondary | ICD-10-CM

## 2018-09-04 DIAGNOSIS — R262 Difficulty in walking, not elsewhere classified: Secondary | ICD-10-CM | POA: Diagnosis not present

## 2018-09-04 DIAGNOSIS — O34211 Maternal care for low transverse scar from previous cesarean delivery: Secondary | ICD-10-CM

## 2018-09-04 DIAGNOSIS — O99214 Obesity complicating childbirth: Secondary | ICD-10-CM | POA: Diagnosis present

## 2018-09-04 DIAGNOSIS — Z3A38 38 weeks gestation of pregnancy: Secondary | ICD-10-CM

## 2018-09-04 DIAGNOSIS — O99284 Endocrine, nutritional and metabolic diseases complicating childbirth: Secondary | ICD-10-CM | POA: Diagnosis present

## 2018-09-04 DIAGNOSIS — O34219 Maternal care for unspecified type scar from previous cesarean delivery: Secondary | ICD-10-CM | POA: Diagnosis present

## 2018-09-04 DIAGNOSIS — E669 Obesity, unspecified: Secondary | ICD-10-CM | POA: Diagnosis present

## 2018-09-04 DIAGNOSIS — E039 Hypothyroidism, unspecified: Secondary | ICD-10-CM | POA: Diagnosis present

## 2018-09-04 DIAGNOSIS — O24415 Gestational diabetes mellitus in pregnancy, controlled by oral hypoglycemic drugs: Secondary | ICD-10-CM

## 2018-09-04 DIAGNOSIS — D649 Anemia, unspecified: Secondary | ICD-10-CM | POA: Diagnosis present

## 2018-09-04 DIAGNOSIS — O9089 Other complications of the puerperium, not elsewhere classified: Secondary | ICD-10-CM | POA: Diagnosis not present

## 2018-09-04 DIAGNOSIS — O0990 Supervision of high risk pregnancy, unspecified, unspecified trimester: Secondary | ICD-10-CM

## 2018-09-04 DIAGNOSIS — O24425 Gestational diabetes mellitus in childbirth, controlled by oral hypoglycemic drugs: Secondary | ICD-10-CM | POA: Diagnosis present

## 2018-09-04 DIAGNOSIS — Z98891 History of uterine scar from previous surgery: Secondary | ICD-10-CM

## 2018-09-04 LAB — POCT FERN TEST: POCT Fern Test: POSITIVE

## 2018-09-04 LAB — CBC
HCT: 38 % (ref 36.0–46.0)
Hemoglobin: 12.6 g/dL (ref 12.0–15.0)
MCH: 30 pg (ref 26.0–34.0)
MCHC: 33.2 g/dL (ref 30.0–36.0)
MCV: 90.5 fL (ref 80.0–100.0)
NRBC: 0 % (ref 0.0–0.2)
Platelets: 142 10*3/uL — ABNORMAL LOW (ref 150–400)
RBC: 4.2 MIL/uL (ref 3.87–5.11)
RDW: 14.9 % (ref 11.5–15.5)
WBC: 8.8 10*3/uL (ref 4.0–10.5)

## 2018-09-04 LAB — TYPE AND SCREEN
ABO/RH(D): O POS
ANTIBODY SCREEN: NEGATIVE

## 2018-09-04 LAB — GLUCOSE, CAPILLARY: GLUCOSE-CAPILLARY: 97 mg/dL (ref 70–99)

## 2018-09-04 LAB — CREATININE, SERUM: CREATININE: 0.68 mg/dL (ref 0.44–1.00)

## 2018-09-04 LAB — RPR: RPR: NONREACTIVE

## 2018-09-04 SURGERY — Surgical Case
Anesthesia: Spinal

## 2018-09-04 MED ORDER — MORPHINE SULFATE (PF) 0.5 MG/ML IJ SOLN
INTRAMUSCULAR | Status: AC
  Filled 2018-09-04: qty 10

## 2018-09-04 MED ORDER — ONDANSETRON HCL 4 MG/2ML IJ SOLN
INTRAMUSCULAR | Status: DC | PRN
  Administered 2018-09-04: 4 mg via INTRAVENOUS

## 2018-09-04 MED ORDER — LACTATED RINGERS IV SOLN
INTRAVENOUS | Status: DC | PRN
  Administered 2018-09-04: 07:00:00 via INTRAVENOUS

## 2018-09-04 MED ORDER — OXYTOCIN 10 UNIT/ML IJ SOLN
INTRAVENOUS | Status: DC | PRN
  Administered 2018-09-04: 40 [IU] via INTRAVENOUS

## 2018-09-04 MED ORDER — KETOROLAC TROMETHAMINE 30 MG/ML IJ SOLN
30.0000 mg | Freq: Four times a day (QID) | INTRAMUSCULAR | Status: AC | PRN
  Administered 2018-09-04: 30 mg via INTRAVENOUS
  Filled 2018-09-04: qty 1

## 2018-09-04 MED ORDER — SCOPOLAMINE 1 MG/3DAYS TD PT72
MEDICATED_PATCH | TRANSDERMAL | Status: AC
  Filled 2018-09-04: qty 1

## 2018-09-04 MED ORDER — LEVOTHYROXINE SODIUM 100 MCG PO TABS
100.0000 ug | ORAL_TABLET | Freq: Every day | ORAL | Status: DC
  Administered 2018-09-04 – 2018-09-06 (×3): 100 ug via ORAL
  Filled 2018-09-04 (×4): qty 1

## 2018-09-04 MED ORDER — LACTATED RINGERS IV SOLN: INTRAVENOUS | Status: DC

## 2018-09-04 MED ORDER — FENTANYL CITRATE (PF) 100 MCG/2ML IJ SOLN
INTRAMUSCULAR | Status: DC | PRN
  Administered 2018-09-04: 35 ug via INTRAVENOUS
  Administered 2018-09-04: 15 ug via INTRATHECAL

## 2018-09-04 MED ORDER — SIMETHICONE 80 MG PO CHEW
80.0000 mg | CHEWABLE_TABLET | Freq: Three times a day (TID) | ORAL | Status: DC
  Administered 2018-09-04 – 2018-09-06 (×6): 80 mg via ORAL
  Filled 2018-09-04 (×6): qty 1

## 2018-09-04 MED ORDER — MORPHINE SULFATE (PF) 0.5 MG/ML IJ SOLN
INTRAMUSCULAR | Status: DC | PRN
  Administered 2018-09-04: .15 mg via INTRATHECAL

## 2018-09-04 MED ORDER — OXYTOCIN 10 UNIT/ML IJ SOLN
INTRAMUSCULAR | Status: AC
  Filled 2018-09-04: qty 4

## 2018-09-04 MED ORDER — COCONUT OIL OIL: 1.0000 "application " | TOPICAL_OIL | Status: DC | PRN

## 2018-09-04 MED ORDER — NALBUPHINE HCL 10 MG/ML IJ SOLN: 5.0000 mg | INTRAMUSCULAR | Status: DC | PRN

## 2018-09-04 MED ORDER — SCOPOLAMINE 1 MG/3DAYS TD PT72
MEDICATED_PATCH | TRANSDERMAL | Status: DC | PRN
  Administered 2018-09-04: 1 via TRANSDERMAL

## 2018-09-04 MED ORDER — SENNOSIDES-DOCUSATE SODIUM 8.6-50 MG PO TABS
2.0000 | ORAL_TABLET | ORAL | Status: DC
  Administered 2018-09-04 – 2018-09-06 (×2): 2 via ORAL
  Filled 2018-09-04 (×2): qty 2

## 2018-09-04 MED ORDER — PHENYLEPHRINE 8 MG IN D5W 100 ML (0.08MG/ML) PREMIX OPTIME
INJECTION | INTRAVENOUS | Status: DC | PRN
  Administered 2018-09-04: 10 ug/min via INTRAVENOUS

## 2018-09-04 MED ORDER — ENOXAPARIN SODIUM 40 MG/0.4ML ~~LOC~~ SOLN
40.0000 mg | SUBCUTANEOUS | Status: DC
  Administered 2018-09-05 – 2018-09-06 (×2): 40 mg via SUBCUTANEOUS
  Filled 2018-09-04 (×2): qty 0.4

## 2018-09-04 MED ORDER — CEFAZOLIN SODIUM-DEXTROSE 2-3 GM-%(50ML) IV SOLR
INTRAVENOUS | Status: DC | PRN
  Administered 2018-09-04: 2 g via INTRAVENOUS

## 2018-09-04 MED ORDER — OXYCODONE HCL 5 MG PO TABS
5.0000 mg | ORAL_TABLET | ORAL | Status: DC | PRN
  Administered 2018-09-05 – 2018-09-06 (×4): 5 mg via ORAL
  Filled 2018-09-04 (×4): qty 1

## 2018-09-04 MED ORDER — SOD CITRATE-CITRIC ACID 500-334 MG/5ML PO SOLN
ORAL | Status: AC
  Filled 2018-09-04: qty 15

## 2018-09-04 MED ORDER — ONDANSETRON HCL 4 MG/2ML IJ SOLN
INTRAMUSCULAR | Status: AC
  Filled 2018-09-04: qty 2

## 2018-09-04 MED ORDER — SODIUM CHLORIDE 0.9% FLUSH: 3.0000 mL | INTRAVENOUS | Status: DC | PRN

## 2018-09-04 MED ORDER — MEASLES, MUMPS & RUBELLA VAC IJ SOLR
0.5000 mL | Freq: Once | INTRAMUSCULAR | Status: DC
  Filled 2018-09-04: qty 0.5

## 2018-09-04 MED ORDER — NALOXONE HCL 4 MG/10ML IJ SOLN
1.0000 ug/kg/h | INTRAVENOUS | Status: DC | PRN
  Filled 2018-09-04: qty 5

## 2018-09-04 MED ORDER — NALOXONE HCL 0.4 MG/ML IJ SOLN: 0.4000 mg | INTRAMUSCULAR | Status: DC | PRN

## 2018-09-04 MED ORDER — LACTATED RINGERS IV SOLN
INTRAVENOUS | Status: DC | PRN
  Administered 2018-09-04 (×2): via INTRAVENOUS

## 2018-09-04 MED ORDER — LACTATED RINGERS IV SOLN
INTRAVENOUS | Status: DC
  Administered 2018-09-04: 06:00:00 via INTRAVENOUS

## 2018-09-04 MED ORDER — ZOLPIDEM TARTRATE 5 MG PO TABS: 5.0000 mg | ORAL_TABLET | Freq: Every evening | ORAL | Status: DC | PRN

## 2018-09-04 MED ORDER — BUPIVACAINE IN DEXTROSE 0.75-8.25 % IT SOLN
INTRATHECAL | Status: DC | PRN
  Administered 2018-09-04: 1.4 mL via INTRATHECAL

## 2018-09-04 MED ORDER — ACETAMINOPHEN 325 MG PO TABS
650.0000 mg | ORAL_TABLET | ORAL | Status: DC | PRN
  Administered 2018-09-04: 650 mg via ORAL
  Filled 2018-09-04: qty 2

## 2018-09-04 MED ORDER — DIPHENHYDRAMINE HCL 50 MG/ML IJ SOLN: 12.5000 mg | INTRAMUSCULAR | Status: DC | PRN

## 2018-09-04 MED ORDER — PRENATAL MULTIVITAMIN CH
1.0000 | ORAL_TABLET | Freq: Every day | ORAL | Status: DC
  Administered 2018-09-04 – 2018-09-06 (×3): 1 via ORAL
  Filled 2018-09-04 (×3): qty 1

## 2018-09-04 MED ORDER — SIMETHICONE 80 MG PO CHEW: 80.0000 mg | CHEWABLE_TABLET | ORAL | Status: DC | PRN

## 2018-09-04 MED ORDER — SIMETHICONE 80 MG PO CHEW
80.0000 mg | CHEWABLE_TABLET | ORAL | Status: DC
  Filled 2018-09-04 (×2): qty 1

## 2018-09-04 MED ORDER — FENTANYL CITRATE (PF) 100 MCG/2ML IJ SOLN
INTRAMUSCULAR | Status: AC
  Filled 2018-09-04: qty 2

## 2018-09-04 MED ORDER — CEFAZOLIN SODIUM-DEXTROSE 2-4 GM/100ML-% IV SOLN
2.0000 g | INTRAVENOUS | Status: DC
  Filled 2018-09-04: qty 100

## 2018-09-04 MED ORDER — WITCH HAZEL-GLYCERIN EX PADS: 1.0000 "application " | MEDICATED_PAD | CUTANEOUS | Status: DC | PRN

## 2018-09-04 MED ORDER — IBUPROFEN 600 MG PO TABS
600.0000 mg | ORAL_TABLET | Freq: Four times a day (QID) | ORAL | Status: DC
  Administered 2018-09-04 – 2018-09-06 (×7): 600 mg via ORAL
  Filled 2018-09-04 (×7): qty 1

## 2018-09-04 MED ORDER — DIBUCAINE 1 % RE OINT: 1.0000 "application " | TOPICAL_OINTMENT | RECTAL | Status: DC | PRN

## 2018-09-04 MED ORDER — MENTHOL 3 MG MT LOZG: 1.0000 | LOZENGE | OROMUCOSAL | Status: DC | PRN

## 2018-09-04 MED ORDER — BUPIVACAINE HCL (PF) 0.25 % IJ SOLN
INTRAMUSCULAR | Status: AC
  Filled 2018-09-04: qty 30

## 2018-09-04 MED ORDER — TETANUS-DIPHTH-ACELL PERTUSSIS 5-2.5-18.5 LF-MCG/0.5 IM SUSP
0.5000 mL | Freq: Once | INTRAMUSCULAR | Status: AC
  Administered 2018-09-06: 0.5 mL via INTRAMUSCULAR
  Filled 2018-09-04: qty 0.5

## 2018-09-04 MED ORDER — DEXTROSE IN LACTATED RINGERS 5 % IV SOLN
INTRAVENOUS | Status: DC
  Administered 2018-09-04: 17:00:00 via INTRAVENOUS

## 2018-09-04 MED ORDER — MEPERIDINE HCL 25 MG/ML IJ SOLN: 6.2500 mg | INTRAMUSCULAR | Status: DC | PRN

## 2018-09-04 MED ORDER — KETOROLAC TROMETHAMINE 30 MG/ML IJ SOLN
30.0000 mg | Freq: Four times a day (QID) | INTRAMUSCULAR | Status: AC | PRN
  Administered 2018-09-04: 30 mg via INTRAMUSCULAR

## 2018-09-04 MED ORDER — SOD CITRATE-CITRIC ACID 500-334 MG/5ML PO SOLN
30.0000 mL | Freq: Once | ORAL | Status: AC
  Administered 2018-09-04: 30 mL via ORAL

## 2018-09-04 MED ORDER — SCOPOLAMINE 1 MG/3DAYS TD PT72: 1.0000 | MEDICATED_PATCH | Freq: Once | TRANSDERMAL | Status: DC

## 2018-09-04 MED ORDER — NALBUPHINE HCL 10 MG/ML IJ SOLN: 5.0000 mg | Freq: Once | INTRAMUSCULAR | Status: DC | PRN

## 2018-09-04 MED ORDER — KETOROLAC TROMETHAMINE 30 MG/ML IJ SOLN
INTRAMUSCULAR | Status: AC
  Filled 2018-09-04: qty 1

## 2018-09-04 MED ORDER — DIPHENHYDRAMINE HCL 25 MG PO CAPS: 25.0000 mg | ORAL_CAPSULE | ORAL | Status: DC | PRN

## 2018-09-04 MED ORDER — DIPHENHYDRAMINE HCL 25 MG PO CAPS: 25.0000 mg | ORAL_CAPSULE | Freq: Four times a day (QID) | ORAL | Status: DC | PRN

## 2018-09-04 MED ORDER — ONDANSETRON HCL 4 MG/2ML IJ SOLN: 4.0000 mg | Freq: Three times a day (TID) | INTRAMUSCULAR | Status: DC | PRN

## 2018-09-04 MED ORDER — OXYCODONE HCL 5 MG PO TABS
10.0000 mg | ORAL_TABLET | ORAL | Status: DC | PRN
  Filled 2018-09-04: qty 2

## 2018-09-04 MED ORDER — BUPIVACAINE HCL (PF) 0.5 % IJ SOLN
INTRAMUSCULAR | Status: DC | PRN
  Administered 2018-09-04: 30 mL

## 2018-09-04 MED ORDER — OXYTOCIN 40 UNITS IN LACTATED RINGERS INFUSION - SIMPLE MED: 2.5000 [IU]/h | INTRAVENOUS | Status: AC

## 2018-09-04 SURGICAL SUPPLY — 37 items
BARRIER ADHS 3X4 INTERCEED (GAUZE/BANDAGES/DRESSINGS) IMPLANT
BENZOIN TINCTURE PRP APPL 2/3 (GAUZE/BANDAGES/DRESSINGS) ×3 IMPLANT
CHLORAPREP W/TINT 26ML (MISCELLANEOUS) ×3 IMPLANT
CLAMP CORD UMBIL (MISCELLANEOUS) IMPLANT
CLOSURE WOUND 1/2 X4 (GAUZE/BANDAGES/DRESSINGS) ×1
CLOTH BEACON ORANGE TIMEOUT ST (SAFETY) ×3 IMPLANT
DRSG OPSITE POSTOP 4X10 (GAUZE/BANDAGES/DRESSINGS) ×3 IMPLANT
ELECT REM PT RETURN 9FT ADLT (ELECTROSURGICAL) ×3
ELECTRODE REM PT RTRN 9FT ADLT (ELECTROSURGICAL) ×1 IMPLANT
EXTRACTOR VACUUM KIWI (MISCELLANEOUS) IMPLANT
GAUZE SPONGE 4X4 12PLY STRL LF (GAUZE/BANDAGES/DRESSINGS) ×6 IMPLANT
GLOVE BIO SURGEON STRL SZ 6.5 (GLOVE) ×2 IMPLANT
GLOVE BIO SURGEONS STRL SZ 6.5 (GLOVE) ×1
GLOVE BIOGEL PI IND STRL 7.0 (GLOVE) ×2 IMPLANT
GLOVE BIOGEL PI INDICATOR 7.0 (GLOVE) ×4
GOWN STRL REUS W/TWL LRG LVL3 (GOWN DISPOSABLE) ×6 IMPLANT
KIT ABG SYR 3ML LUER SLIP (SYRINGE) IMPLANT
NEEDLE HYPO 22GX1.5 SAFETY (NEEDLE) IMPLANT
NEEDLE HYPO 25X5/8 SAFETYGLIDE (NEEDLE) IMPLANT
NS IRRIG 1000ML POUR BTL (IV SOLUTION) ×3 IMPLANT
PACK C SECTION WH (CUSTOM PROCEDURE TRAY) ×3 IMPLANT
PAD ABD 7.5X8 STRL (GAUZE/BANDAGES/DRESSINGS) ×3 IMPLANT
PAD ABD 8X7 1/2 STERILE (GAUZE/BANDAGES/DRESSINGS) ×3 IMPLANT
PAD OB MATERNITY 4.3X12.25 (PERSONAL CARE ITEMS) ×3 IMPLANT
PENCIL SMOKE EVAC W/HOLSTER (ELECTROSURGICAL) ×3 IMPLANT
RETRACTOR WND ALEXIS 25 LRG (MISCELLANEOUS) IMPLANT
RTRCTR WOUND ALEXIS 25CM LRG (MISCELLANEOUS)
SPONGE GAUZE 4X4 12PLY (GAUZE/BANDAGES/DRESSINGS) ×6 IMPLANT
SPONGE LAP 18X18 RF (DISPOSABLE) ×9 IMPLANT
STRIP CLOSURE SKIN 1/2X4 (GAUZE/BANDAGES/DRESSINGS) ×2 IMPLANT
SUT VIC AB 0 CT1 36 (SUTURE) ×18 IMPLANT
SUT VIC AB 2-0 CT1 27 (SUTURE) ×2
SUT VIC AB 2-0 CT1 TAPERPNT 27 (SUTURE) ×1 IMPLANT
SUT VIC AB 4-0 PS2 27 (SUTURE) ×3 IMPLANT
SYR CONTROL 10ML LL (SYRINGE) IMPLANT
TOWEL OR 17X24 6PK STRL BLUE (TOWEL DISPOSABLE) ×3 IMPLANT
TRAY FOLEY W/BAG SLVR 14FR LF (SET/KITS/TRAYS/PACK) IMPLANT

## 2018-09-04 NOTE — Anesthesia Procedure Notes (Signed)
Spinal  Patient location during procedure: OR Start time: 09/04/2018 6:36 AM End time: 09/04/2018 6:40 AM Staffing Anesthesiologist: Mal Amabile, MD Performed: anesthesiologist  Preanesthetic Checklist Completed: patient identified, site marked, surgical consent, pre-op evaluation, timeout performed, IV checked, risks and benefits discussed and monitors and equipment checked Spinal Block Patient position: sitting Prep: site prepped and draped and DuraPrep Patient monitoring: heart rate, cardiac monitor, continuous pulse ox and blood pressure Approach: midline Location: L4-5 Injection technique: single-shot Needle Needle type: Pencan  Needle gauge: 24 G Needle length: 9 cm Needle insertion depth: 5 cm Assessment Sensory level: T4 Additional Notes Patient tolerated procedure well. Adequate sensory level.

## 2018-09-04 NOTE — Addendum Note (Signed)
Addendum  created 09/04/18 1827 by Rica Records, CRNA   Sign clinical note

## 2018-09-04 NOTE — H&P (Addendum)
Ashley Rhodes is an 32 y.o. [phone] [id number].   Chief Complaint: Water broke HPI: Here today for PROM. Prior C-section x 1, desires RCS. H/o GDM, with GDM this pregnancy. On Metformin.  Past Medical History:  Diagnosis Date  . Four previous spontaneous abortions (SAB) affecting care of mother, antepartum   . Gestational diabetes   . Hypothyroidism   . Supervision of high risk pregnancy, antepartum 03/07/2018    [place]   LMP Language  English Anatomy [place]  Normal female  Flu Vaccine  Declined 07/08/18 Genetic Screen  NIPS: Declined  AFP:   First Screen:  Quad:   TDaP vaccine   declined Hgb A1C or  GTT Early  Third trimester  Rhogam   n/a   LAB RESULTS  Feeding Plan Breast Blood Type    Contraception Undecided Antibody   Circumcision No Rubella   Pediatrician      Past Surgical History:  Procedure Laterality Date  . CESAREAN SECTION N/A 08/27/2016   Procedure: CESAREAN SECTION;  Surgeon: UOsborne Oman MD;  [place] [facility]  Service: Obstetrics;  Laterality: N/A;  . DILATION AND CURETTAGE OF UTERUS      Family History  Problem Relation Age of Onset  . Diabetes Mother   . Diabetes Father    Social History:  reports that she has never smoked. She has never used smokeless tobacco. She reports that she does not drink alcohol or use drugs.   No Known Allergies  Medications Prior to Admission  Medication Sig Dispense Refill  . ACCU-CHEK FASTCLIX LANCETS MISC 1 each by Percutaneous route 4 (four) times daily. 50 each 12  . Blood Glucose Monitoring Suppl (ACCU-CHEK GUIDE) w/Device KIT 1 each by Does not apply route 4 (four) times daily. 1 kit 0  . glucose blood (ACCU-CHEK GUIDE) test strip Use as instructed 4 times daily 100 each 12  . levothyroxine (SYNTHROID) 50 MCG tablet Take 2 tablets (100 mcg total) by mouth daily. 30 tablet 2  . metFORMIN (GLUCOPHAGE) 500 MG tablet TAKE ONE TABLET BY MOUTH TWICE DAILY WITH A  MEAL 60 tablet 1  . omeprazole (PRILOSEC) 40 MG capsule omeprazole 40 mg capsule,delayed release    . Prenatal Vit-Fe Fumarate-FA (PRENATAL MULTIVITAMIN) TABS tablet Take 1 tablet by mouth daily at 12 noon.    . fluticasone (FLONASE) 50 MCG/ACT nasal spray fluticasone propionate 50 mcg/actuation nasal spray,suspension       Pertinent items are noted in HPI.  Blood pressure 127/74, pulse 84, temperature 97.6 F (36.4 C), resp. rate 20, height 4' 11"  (1.499 m), weight 73.9 kg, last menstrual period 12/11/2017, currently breastfeeding. General appearance: alert, cooperative and appears stated age Head: Normocephalic, without obvious abnormality, atraumatic Neck: supple, symmetrical, trachea midline Lungs: normal effort Heart: regular rate and rhythm Abdomen: gravid, Non-tender Extremities: Homans sign is negative, no sign of DVT Skin: Skin color, texture, turgor normal. No rashes or lesions Neurologic: Grossly normal Dilation: 4 Effacement (%): 90 Station: -3 Presentation: Vertex Exam by:: Lauren Cox RN   NST:  Baseline: 145 bpm, Variability: Good {> 6 bpm), Accelerations: 10 x 10 accels and Decelerations: Variable: moderate  .  Lab Results  Component Value Date   WBC 7.0 08/01/2018   HGB 11.7 08/01/2018   HCT 35.8 08/01/2018   MCV 85 08/01/2018   PLT 138 (L) 08/01/2018         ABO, Rh: O/Positive/-- (05/13 0920)  Antibody: Negative (05/13 0920)  Rubella: 7.66 (05/13 0920)  RPR: Non Reactive (08/30 0843)  HBsAg: Negative (05/13 0920)  HIV: Non Reactive (08/30 0843)  GBS: Negative (10/28 1003)     Assessment/Plan Previous cesarean section  Supervision of high risk pregnancy, antepartum  Gestational diabetes mellitus (GDM) in second trimester controlled on oral hypoglycemic drug  Full-term premature rupture of membranes with onset of labor within 24 hours of rupture  Desires RCS Risks include but are not limited to bleeding, infection, injury to surrounding  structures, including bowel, bladder and ureters, blood clots, and death.  Likelihood of success is high.    Ashley Rhodes 09/04/2018, 5:35 AM

## 2018-09-04 NOTE — MAU Note (Signed)
Dennis Bast RN OR staff notified pt rolling to OR as type and screen is back. No further questions asked.

## 2018-09-04 NOTE — Lactation Note (Signed)
This note was copied from a baby's chart. Lactation Consultation Note  Patient Name: Ashley Rhodes [id number] Date: 09/04/2018   Mom with GDM on metformin/csection/obese/infant less than 6 pounds. Early term infant.Mom with DEBP in room but has not started using it.   Mom does not have a breastpump of any type for home use. Breastfed first baby for 4 months using a nipple shield.  Mom with short shaft small/medium nipples.  Assist with prepumping to help nipple evert more.  Infant able to latch well after prepumping and shaping of the breast to help him get more in his mouth.  Mom reports comfort.  Questionable lip blister noted before feeding. Mom reports not able to sit up so with laying back nipples and breasts are flatter. Mom able to hand express colostrum easily.  Urged mom to breastfeed and then hand express and spoon feed past all EBM past breastfeedings.  Urged mom to do this when he does not wake to feed 8 or more times/day. Reviewed understanding Healthy Mom and Baby.   Gave Yellow breastfeeding log and encouraged parents to keep travk of all voids/stools and feedings.  Gave Breastfeeding Research scientist (life sciences) and Breastfeeding Energy manager.  Urged mom to come to BFSG.  Infant stooled while we were attempting to latch art first before prepumping.  Changed diaper.  Stool looked green like transitional stool already.  Infant also had void.Urged mom to call as needed for feeding assistance.  Will follow up PRN.  Maternal Data    Feeding    LATCH Score Latch: Repeated attempts needed to sustain latch, nipple held in mouth throughout feeding, stimulation needed to elicit sucking reflex.  Audible Swallowing: A few with stimulation  Type of Nipple: Everted at rest and after stimulation(Nipple short shaft.)  Comfort (Breast/Nipple): Soft / non-tender  Hold (Positioning): Assistance needed to correctly position infant at breast and maintain latch.  LATCH Score:  7  Interventions Interventions: Breast feeding basics reviewed;Assisted with latch;Skin to skin;Hand express;Adjust position;Support pillows  Lactation Tools Discussed/Used     Consult Status      Neomia Dear 09/04/2018, 2:37 PM

## 2018-09-04 NOTE — Anesthesia Preprocedure Evaluation (Signed)
Anesthesia Evaluation  Patient identified by MRN, date of birth, ID band Patient awake    Reviewed: Allergy & Precautions, NPO status , Patient's Chart, lab work & pertinent test results  Airway Mallampati: III  TM Distance: >3 FB Neck ROM: Full    Dental no notable dental hx. (+) Teeth Intact   Pulmonary neg pulmonary ROS,    Pulmonary exam normal breath sounds clear to auscultation       Cardiovascular negative cardio ROS Normal cardiovascular exam Rhythm:Regular Rate:Normal     Neuro/Psych Meralgia paresthetica left during this pregnancy  Neuromuscular disease negative psych ROS   GI/Hepatic Neg liver ROS, GERD  Medicated,  Endo/Other  diabetes, Well Controlled, Gestational, Oral Hypoglycemic AgentsHypothyroidism Obesity  Renal/GU negative Renal ROS  negative genitourinary   Musculoskeletal negative musculoskeletal ROS (+)   Abdominal (+) + obese,   Peds  Hematology negative hematology ROS (+)   Anesthesia Other Findings   Reproductive/Obstetrics (+) Pregnancy Previous C/Section GDM SROM                             Anesthesia Physical Anesthesia Plan  ASA: III  Anesthesia Plan: Spinal   Post-op Pain Management:    Induction:   PONV Risk Score and Plan: 4 or greater and Scopolamine patch - Pre-op, Dexamethasone, Ondansetron and Treatment may vary due to age or medical condition  Airway Management Planned: Natural Airway  Additional Equipment:   Intra-op Plan:   Post-operative Plan:   Informed Consent: I have reviewed the patients History and Physical, chart, labs and discussed the procedure including the risks, benefits and alternatives for the proposed anesthesia with the patient or authorized representative who has indicated his/her understanding and acceptance.   Dental advisory given  Plan Discussed with: CRNA and Surgeon  Anesthesia Plan Comments:          Anesthesia Quick Evaluation

## 2018-09-04 NOTE — Transfer of Care (Signed)
Immediate Anesthesia Transfer of Care Note  Patient: Ashley Rhodes  Procedure(s) Performed: CESAREAN SECTION (N/A )  Patient Location: PACU  Anesthesia Type:Spinal  Level of Consciousness: awake, alert  and oriented  Airway & Oxygen Therapy: Patient Spontanous Breathing  Post-op Assessment: Report given to RN and Post -op Vital signs reviewed and stable  Post vital signs: Reviewed and stable  Last Vitals:  Vitals Value Taken Time  BP    Temp    Pulse    Resp    SpO2      Last Pain:  Vitals:   09/04/18 0457  PainSc: 4       Patients Stated Pain Goal: 0 (09/04/18 0457)  Complications: No apparent anesthesia complications

## 2018-09-04 NOTE — MAU Note (Signed)
Leaking fld since 0330. Clear fld. Occ ctxs.

## 2018-09-04 NOTE — Anesthesia Postprocedure Evaluation (Signed)
Anesthesia Post Note  Patient: Keishawna Pokharel Khanal  Procedure(s) Performed: CESAREAN SECTION (N/A )     Patient location during evaluation: PACU Anesthesia Type: Spinal Level of consciousness: awake and alert Pain management: pain level controlled Vital Signs Assessment: post-procedure vital signs reviewed and stable Respiratory status: spontaneous breathing, nonlabored ventilation and respiratory function stable Cardiovascular status: blood pressure returned to baseline and stable Postop Assessment: no apparent nausea or vomiting and spinal receding Anesthetic complications: no    Last Vitals:  Vitals:   09/04/18 0847 09/04/18 0900  BP: 108/70 (!) 100/58  Pulse: 73 69  Resp: 17 20  Temp: 36.6 C   SpO2: 98% 99%    Last Pain:  Vitals:   09/04/18 0847  TempSrc: Oral  PainSc:    Pain Goal: Patients Stated Pain Goal: 0 (09/04/18 0457)  LLE Motor Response: Purposeful movement (09/04/18 0900) LLE Sensation: Numbness (09/04/18 0900) RLE Motor Response: Purposeful movement (09/04/18 0900) RLE Sensation: Numbness (09/04/18 0900)      Kaylyn Layer

## 2018-09-04 NOTE — MAU Note (Signed)
Provider Notified.  AC Notified [facility] stated she was with the OR coordinator and would pass the message to her)  Anesthesia Notified.

## 2018-09-04 NOTE — Op Note (Signed)
Preoperative Diagnosis:  IUP @ [redacted]w[redacted]d, Previous C-section x 1, declines VBAC, PROM, active labor   Postoperative Diagnosis:  Same  Procedure: Repeat low transverse cesarean section  Surgeon: Tinnie Gens, M.D.  Assistant: None  Findings: Viable female infant, APGAR (1 MIN): 9  APGAR (5 MINS): 9   vtx presentation, nuchal cord x 4   Estimated blood loss: 1000 cc  Complications: None known  Specimens: Placenta to labor and delivery  Reason for procedure: Briefly, the patient is a 32 y.o. [id number] [redacted]w[redacted]d who presents for PROM  Procedure: Patient is a to the OR where spinal analgesia was administered. She was then placed in a supine position with left lateral tilt. She received 2 g of Ancef and SCDs were in place. A timeout was performed. She was prepped and draped in the usual sterile fashion. A Foley catheter was placed in the bladder. A knife was then used to make a Pfannenstiel incision. This incision was carried out to underlying fascia which was divided in the midline with the knife. The incision was extended laterally, sharply. The rectus was divided in the midline. The peritoneal cavity was entered bluntly. Alexis retractor was placed inside the incision. A knife was used to make a low transverse incision on the uterus. This incision was carried down to the amniotic cavity was entered. Fetus was in vertex position and was brought up out of the incision without difficulty. Cord was clamped x 2 and cut. Infant taken to waiting nurse. Cord blood was obtained. Placenta was delivered from the uterus.  Uterus was cleaned with dry lap pads. Uterine incision closed with 0 Vicryl suture in a locked running fashion. A second layer of 0 Vicryl in an imbricating fashion was used to achieve hemostasis. Alexis retractor was removed from the abdomen. Peritoneal closure was done with 0 Vicryl suture. Omentum adhesion taken down. Fascia is closed with 0 Vicryl suture in a running fashion. Subcutaneous tissue  infused with 30cc 0.25% Marcaine. Skin closed using 3-0 Vicryl on a Keith needle.  Steri strips applied, followed by pressure dressing.  All instrument, needle and lap counts were correct x 2. Patient was awake and taken to PACU stable. Infant remained with mom in couplet care, stable.   Shelbie Proctor PrattMD 09/04/2018 8:59 AM

## 2018-09-04 NOTE — Anesthesia Postprocedure Evaluation (Signed)
Anesthesia Post Note  Patient: Ashley Rhodes  Procedure(s) Performed: CESAREAN SECTION (N/A )     Patient location during evaluation: Mother Baby Anesthesia Type: Spinal Level of consciousness: oriented and awake and alert Pain management: pain level controlled Vital Signs Assessment: post-procedure vital signs reviewed and stable Respiratory status: spontaneous breathing, respiratory function stable and patient connected to nasal cannula oxygen Cardiovascular status: blood pressure returned to baseline and stable Postop Assessment: no headache, no backache, no apparent nausea or vomiting and patient able to bend at knees Anesthetic complications: no    Last Vitals:  Vitals:   09/04/18 1604 09/04/18 1707  BP:    Pulse:    Resp: 19   Temp: 36.9 C 37.2 C  SpO2:      Last Pain:  Vitals:   09/04/18 1658  TempSrc:   PainSc: 2    Pain Goal: Patients Stated Pain Goal: 2 (09/04/18 1658)               Rica Records

## 2018-09-05 ENCOUNTER — Encounter: Payer: Self-pay | Admitting: Family Medicine

## 2018-09-05 ENCOUNTER — Other Ambulatory Visit: Payer: Self-pay

## 2018-09-05 LAB — GLUCOSE, CAPILLARY: GLUCOSE-CAPILLARY: 93 mg/dL (ref 70–99)

## 2018-09-05 LAB — CREATININE, SERUM: Creatinine, Ser: 0.75 mg/dL (ref 0.44–1.00)

## 2018-09-05 LAB — CBC
HCT: 28.1 % — ABNORMAL LOW (ref 36.0–46.0)
Hemoglobin: 9.4 g/dL — ABNORMAL LOW (ref 12.0–15.0)
MCH: 30.3 pg (ref 26.0–34.0)
MCHC: 33.5 g/dL (ref 30.0–36.0)
MCV: 90.6 fL (ref 80.0–100.0)
NRBC: 0 % (ref 0.0–0.2)
PLATELETS: 101 10*3/uL — AB (ref 150–400)
RBC: 3.1 MIL/uL — AB (ref 3.87–5.11)
RDW: 15.4 % (ref 11.5–15.5)
WBC: 8 10*3/uL (ref 4.0–10.5)

## 2018-09-05 NOTE — Progress Notes (Signed)
Attempted to get patient up to bathroom for third time today. Patient reported excrutiating pain on th left side of her incision and extreme muscle cramping in both upper legs when trying to stand. Pt also reported feeling dizzy and lightheaded when standing to side of bed. See vitals. Physician notified

## 2018-09-05 NOTE — Progress Notes (Signed)
Called the PT department at [facility] to confirm they were aware of the PT referral and treat order. Informed Margie, PT will be coming today to see patient.

## 2018-09-05 NOTE — Progress Notes (Addendum)
Resident MD at bedside. No new orders received. Advised pt if stronger pain meds were needed to let nurse know. Resident MD advised patient to not get out of bed until tomorrow and to get plenty of rest.

## 2018-09-05 NOTE — Progress Notes (Addendum)
POSTPARTUM PROGRESS NOTE  POD #1  Subjective:  Ashley Rhodes is a 32 y.o. [id number] s/p rLTCS at [redacted]w[redacted]d.  She reports she doing okay. No acute events overnight. She reports she is having bilateral leg pain with rest and ambulation that is worse on the left than the right. She states she was diagnosed with meralgia paresthetica during her pregnancy and that the pain she is currently experiencing is consistent with that of her pregnancy. She describes the pain as numbness and burning. Endorses left sided calf cramping as well. She denies any problems with voiding or po intake. Denies nausea or vomiting. She has passed flatus and BM. Pain is moderately controlled.  Lochia is improving.  Objective: Blood pressure (!) 97/54, pulse 91, temperature 98.5 F (36.9 C), temperature source Oral, resp. rate 20, height 4\' 11"  (1.499 m), weight 73.9 kg, last menstrual period 12/11/2017, SpO2 97 %, unknown if currently breastfeeding.  Physical Exam:  General: alert, cooperative and no distress Chest: no respiratory distress Heart:regular rate, distal pulses intact Abdomen: soft, nontender,  Uterine Fundus: firm, appropriately tender DVT Evaluation: No calf swelling bilaterally. Left sided calf pain with palpation Extremities: No edema MSK: 5/5 strength with right hip flexion, 4/5 strength with left hip flexion. Pain with external rotation of left hip. No pain with internal rotation bilaterally. Ankle flexion/extension strength 5/5 bilaterally.  Skin: warm, dry; incision clean/dry/intact w/honeycomb dressing in place  Recent Labs    09/04/18 0531 09/05/18 0544  HGB 12.6 9.4*  HCT 38.0 28.1*    Assessment/Plan: Ashley Rhodes is a 32 y.o. [id number] s/p rLTCS at [redacted]w[redacted]d for PROM.  POD#1 - Doing welll; pain moderately controlled. Hemoglobin 12.6--> 9.4 and asymptomatic. Blood pressures soft, orthostatics negative.    -Routine postpartum care  -OOB, ambulated with 2 person assist this  morning. Nursing reported difficulty with ambulating to  bathroom. Will consult PT   -Lovenox for VTE prophylaxis History of A2GDM: FBG 97, continue to monitor. Metformin discontinued Anemia: asymptomatic  Start po ferrous sulfate BID Contraception: undecided Feeding: breast  Dispo: Plan for discharge pending clinical improvement.  LOS: 1 day   Jan Fireman PA-S 09/05/2018

## 2018-09-05 NOTE — Progress Notes (Signed)
Reported to PA student who was rounding that patient still has her foley catheter due patient's inability to ambulate to bathroom without assistance of two nurses. Patient is not baring weight on her legs due to pain in upper thighs.

## 2018-09-05 NOTE — Lactation Note (Signed)
This note was copied from a baby's chart. Lactation Consultation Note  Patient Name: Ashley Rhodes [id number] Date: 09/05/2018 Reason for consult: Follow-up assessment;Early term 37-38.6wks;Infant < 6lbs Type of Endocrine Disorder?: Diabetes   Follow up with mom of 31 hour old infant. Infant with 7 BF for 10-30 minutes, 1 BF attempt, 4 voids and 4 stools in the last 24 hours. Infant weight 5 pounds 7.7 ounces with 6% weight loss since birth. LATCH scores 9. Infant asleep in his crib and parents report infant is feeding well. Parents reports infant last fed 1 hour ago. Mom denies pain with feeding and reports her breasts are feeling heavier today.   Mom reports infant is being awakened about every 1.5-2 hours to feed. Infant will go to the breast and gets very sleepy with feeding. Discussed this it normal feeding behavior to ET infant. Enc mom to feed infant STS and to stimulate infant as needed with feeding. Enc mom to massage breast with feedings to help with milk transfer. Mom reports she has not been spoon feeding infant, enc mom to spoon feed infant with each feeding either before feeding if sleepy and after each BF. Mom voiced understanding.   Enc mom to call out for next feeding to get help with feeding, mom voiced understanding.    Maternal Data Has patient been taught Hand Expression?: Yes Does the patient have breastfeeding experience prior to this delivery?: Yes  Feeding Feeding Type: Breast Fed  LATCH Score                   Interventions    Lactation Tools Discussed/Used     Consult Status Consult Status: Follow-up Date: 09/06/18 Follow-up type: In-patient    [name] 09/05/2018, 2:17 PM

## 2018-09-05 NOTE — Evaluation (Signed)
Physical Therapy Evaluation Patient Details Name: Ashley Rhodes MRN: [id number] DOB: [birth year] Today's Date: 09/05/2018   History of Present Illness  Pt s/p c-section on 09/04/18. Pt with meralgia paresthetica diagnosed during pregnancy and causing her bilateral lower extremity pain. PMH - gestational diabetes  Clinical Impression  Pt presents to PT with decreased mobility due to bilateral thigh pain. Pt able to mobilize in room and in hallways using walker. Pt also able to amb in room using furniture for support. Pt will have good support at home and understands not to try to carry the baby. Walker left in room for pt to use. Pt/husband declined walker for home due to with small environment pt can use furniture for support if needed and I agree. Hopefully pt's pain will continue to improve now that pregnancy complete. Instructed pt to follow up with physician if pain is not improving in the next several weeks. Pt okay to return home with family from PT standpoint when medically ready.    Follow Up Recommendations No PT follow up    Equipment Recommendations  None recommended by PT    Recommendations for Other Services       Precautions / Restrictions Precautions Precautions: None Restrictions Weight Bearing Restrictions: No      Mobility  Bed Mobility Overal bed mobility: Modified Independent                Transfers Overall transfer level: Modified independent Equipment used: None;Rolling walker (2 wheeled)                Ambulation/Gait Ambulation/Gait assistance: Supervision;Modified independent (Device/Increase time) Gait Distance (Feet): 150 Feet Assistive device: Rolling walker (2 wheeled) Gait Pattern/deviations: Step-to pattern;Decreased step length - right;Decreased step length - left;Decreased stance time - right;Decreased weight shift to right Gait velocity: decr Gait velocity interpretation: <1.31 ft/sec, indicative of household  ambulator General Gait Details: Steady gait with walker  Stairs            Wheelchair Mobility    Modified Rankin (Stroke Patients Only)       Balance Overall balance assessment: No apparent balance deficits (not formally assessed)                                           Pertinent Vitals/Pain Pain Assessment: Faces Faces Pain Scale: Hurts little more Pain Location: bilateral thighs Pain Descriptors / Indicators: Cramping;Numbness Pain Intervention(s): Limited activity within patient's tolerance    Home Living Family/patient expects to be discharged to:: Private residence Living Arrangements: Spouse/significant other;Parent Available Help at Discharge: Family;Available 24 hours/day Type of Home: Apartment Home Access: Stairs to enter Entrance Stairs-Rails: Right Entrance Stairs-Number of Steps: 14 Home Layout: One level Home Equipment: None      Prior Function Level of Independence: Independent               Hand Dominance        Extremity/Trunk Assessment   Upper Extremity Assessment Upper Extremity Assessment: Overall WFL for tasks assessed    Lower Extremity Assessment Lower Extremity Assessment: RLE deficits/detail;LLE deficits/detail RLE Deficits / Details: limited by pain LLE Deficits / Details: limited by pain       Communication   Communication: No difficulties  Cognition Arousal/Alertness: Awake/alert Behavior During Therapy: WFL for tasks assessed/performed Overall Cognitive Status: Within Functional Limits for tasks assessed  General Comments      Exercises     Assessment/Plan    PT Assessment Patent does not need any further PT services  PT Problem List         PT Treatment Interventions      PT Goals (Current goals can be found in the Care Plan section)  Acute Rehab PT Goals PT Goal Formulation: All assessment and education complete, DC  therapy    Frequency     Barriers to discharge        Co-evaluation               AM-PAC PT "6 Clicks" Daily Activity  Outcome Measure Difficulty turning over in bed (including adjusting bedclothes, sheets and blankets)?: A Little Difficulty moving from lying on back to sitting on the side of the bed? : A Little Difficulty sitting down on and standing up from a chair with arms (e.g., wheelchair, bedside commode, etc,.)?: A Little Help needed moving to and from a bed to chair (including a wheelchair)?: None Help needed walking in hospital room?: None Help needed climbing 3-5 steps with a railing? : A Little 6 Click Score: 20    End of Session   Activity Tolerance: Patient tolerated treatment well Patient left: in bed;with family/visitor present Nurse Communication: Mobility status PT Visit Diagnosis: Difficulty in walking, not elsewhere classified (R26.2)    Time: 1610-9604 PT Time Calculation (min) (ACUTE ONLY): 29 min   Charges:   PT Evaluation $PT Eval Moderate Complexity: 1 Mod PT Treatments $Gait Training: 8-22 mins        [facility] PT Acute Rehabilitation Services Pager [phone] Office [phone]   Angelina Ok [facility] 09/05/2018, 3:38 PM

## 2018-09-05 NOTE — Progress Notes (Signed)
Pt up to bathroom for first time since delivery with 2-person assist. Pt shuffled feet while walking and had a difficult time lifting feet to take steps.

## 2018-09-06 MED ORDER — OXYCODONE HCL 5 MG PO TABS: 5.0000 mg | ORAL_TABLET | ORAL | 0 refills | Status: AC | PRN

## 2018-09-06 MED ORDER — SENNOSIDES-DOCUSATE SODIUM 8.6-50 MG PO TABS: 2.0000 | ORAL_TABLET | ORAL | 0 refills | Status: AC

## 2018-09-06 NOTE — Progress Notes (Cosign Needed)
POSTPARTUM PROGRESS NOTE  POD #2  Subjective:  Ashley Rhodes is a 32 y.o. [id number] s/p rLTCSLTCS at 7053w1d. No acute events overnight. She reports she is doing well. She reported having bilateral calf cramping that worsens with ambulating. PT saw her yesterday and she was given a walker for stability when she ambulates. She denies any problems with voiding or po intake. Denies nausea or vomiting. She has not had a bowel movement but is passing flatus. Pain is well controlled.  Lochia is mild.  Objective: Blood pressure 106/70, pulse 84, temperature 98.2 F (36.8 C), temperature source Oral, resp. rate 18, height 4\' 11"  (1.499 m), weight 73.9 kg, last menstrual period 12/11/2017, SpO2 96 %, unknown if currently breastfeeding.  Physical Exam:  General: alert, cooperative and no distress Chest: no respiratory distress Heart:regular rate, distal pulses intact Abdomen: soft, appropriately tender surrounding incision Uterine Fundus: firm, appropriately tender DVT Evaluation: No calf swelling or tenderness Extremities: no edema, leg cramps Skin: warm, dry; incision clean/dry/intact w/ honeycomb dressing in place  Recent Labs    09/04/18 0531 09/05/18 0544  HGB 12.6 9.4*  HCT 38.0 28.1*    Assessment/Plan: Ashley Rhodes is a 32 y.o. [id number] s/p rLTCS at 8953w1d for PROM.  POD#2 - Doing welll; pain well controlled. H/H appropriate  Routine postpartum care  OOB, ambulated  Lovenox for VTE prophylaxis Anemia: asymptomatic  Start po ferrous sulfate BID Difficulty ambulating due to calf cramping. PT saw her yesterday and gave her a walker to assist. Lovenox for DVT prophylaxis.   Contraception: undecided Feeding: breast  Dispo: Plan for discharge tomorrow.   LOS: 2 days   Avie ArenasMolly Suzana Sohail, PA-S 09/06/2018, 7:19 AM

## 2018-09-06 NOTE — Lactation Note (Signed)
This note was copied from a baby's chart. Lactation Consultation Note  Patient Name: Ashley Rhodes [id number] Date: 09/06/2018 Reason for consult: Follow-up assessment Type of Endocrine Disorder?: Diabetes   P2, Baby < 6 lbs.  [redacted]w[redacted]d.  8.5% weight loss.  2.3% in the last 24 hours.  4 voids and 4 stools in the last 24 hours.   Baby latched in cradle hold with intermittent swallows. Offered to set up DEBP but mother states she would prefer to hand express. Mother does have manual pump and states she knows how to use it. Reviewed hand expression and demonstrated how to hand express into colostrum container, spoon and cup feed. Recommend she supplement with her breastmilk at each feeding until she has weight check to insure weight continues to stabalize.  Discussed milk storage. Reviewed engorgement care and monitoring voids/stools.    Maternal Data Has patient been taught Hand Expression?: Yes  Feeding Feeding Type: Breast Fed  LATCH Score Latch: Grasps breast easily, tongue down, lips flanged, rhythmical sucking.(latched upon entering)  Audible Swallowing: A few with stimulation  Type of Nipple: Everted at rest and after stimulation  Comfort (Breast/Nipple): Soft / non-tender  Hold (Positioning): No assistance needed to correctly position infant at breast.  LATCH Score: 9  Interventions Interventions: Breast feeding basics reviewed;Hand express;Hand pump  Lactation Tools Discussed/Used     Consult Status Consult Status: Complete Date: 09/06/18    [name] [facility] 09/06/2018, 10:17 AM

## 2018-09-06 NOTE — Discharge Summary (Signed)
Postpartum Discharge Summary     Patient Name: Ashley Rhodes DOB: 06-06-1986 MRN: [id number]  Date of admission: 09/04/2018 Delivering Provider: [name]   Date of discharge: 09/06/2018  Admitting diagnosis: 38wks water broke Intrauterine pregnancy: [redacted]w[redacted]d    Secondary diagnosis:  Active Problems:   Previous cesarean section   Previous cesarean delivery affecting pregnancy, antepartum  Additional problems: GDMA2, hypothyroidism, PROM      Discharge diagnosis: Term Pregnancy Delivered, GDM A2 and Anemia                                                                                                Post partum procedures:None  Augmentation: None  Complications: [id number] Hospital course:  Onset of Labor With Unplanned C/S  32y.o. yo [id number] 336w1das admitted in Latent Labor on 09/04/2018. Patient had a labor course significant for PROM with desired RCS. Membrane Rupture Time/Date: 3:30 AM ,09/04/2018   The patient went for cesarean section due to Elective Repeat, and delivered a Viable infant,09/04/2018  Details of operation can be found in separate operative note. Patient had an uncomplicated postpartum course.  She is ambulating,tolerating a regular diet, passing flatus, and urinating well.  Patient is discharged home in stable condition 09/06/18.  Magnesium Sulfate recieved: No BMZ received: No  Physical exam  Vitals:   09/05/18 0913 09/05/18 1438 09/05/18 2241 09/06/18 0633  BP: 104/68 118/79 118/76 106/70  Pulse: 88 98 93 84  Resp: _0 Temp: 98.3 F (36.8 C) 98.9 F (37.2 C)  98.2 F (36.8 C)  TempSrc: Oral Oral  Oral  SpO2:   96%   Weight:      Height:       General: alert, cooperative and no distress Lochia: appropriate Uterine Fundus: firm Incision: Dressing is clean, dry, and intact DVT Evaluation: No evidence of DVT seen on physical exam. Labs: Lab Results  Component Value Date   WBC 8.0 09/05/2018   HGB 9.4 (L)  09/05/2018   HCT 28.1 (L) 09/05/2018   MCV 90.6 09/05/2018   PLT 101 (L) 09/05/2018   CMP Latest Ref Rng & Units 09/05/2018  Glucose 65 - 104 mg/dL -  Creatinine 0.44 - 1.00 mg/dL 0.75    Discharge instruction: per After Visit Summary and "Baby and Me Booklet".  After visit meds:  Allergies as of 09/06/2018   No Known Allergies     Medication List    STOP taking these medications   ACCU-CHEK FASTCLIX LANCETS Misc   ACCU-CHEK GUIDE w/Device Kit   glucose blood test strip   metFORMIN 500 MG tablet Commonly known as:  GLUCOPHAGE     TAKE these medications   acetaminophen 500 MG tablet Commonly known as:  TYLENOL Take 1,000 mg by mouth every 6 (six) hours as needed for headache.   levothyroxine 50 MCG tablet Commonly known as:  SYNTHROID, LEVOTHROID Take 2 tablets (100 mcg total) by mouth daily.   omeprazole 40 MG capsule Commonly known as:  PRILOSEC Take 40 mg by mouth daily as needed (indigestion).   oxyCODONE 5  MG immediate release tablet Commonly known as:  Oxy IR/ROXICODONE Take 1 tablet (5 mg total) by mouth every 4 (four) hours as needed (pain scale 4-7).   prenatal multivitamin Tabs tablet Take 1 tablet by mouth daily at 12 noon.   senna-docusate 8.6-50 MG tablet Commonly known as:  Senokot-S Take 2 tablets by mouth daily. Start taking on:  09/07/2018       Diet: routine diet  Activity: Advance as tolerated. Pelvic rest for 6 weeks.   Outpatient follow up: 2 and 6 weeks Follow up Appt: Future Appointments  Date Time Provider [place]  10/11/2018  1:55 PM Tamala Julian [place], [place] [facility]   Follow up Visit:   Please schedule this patient for Postpartum visit in:2 and 6 weeks with the following provider: Any provider For C/S patients schedule nurse incision check in weeks 2 weeks: yes High risk pregnancy complicated by: GDM Delivery mode:  CS Anticipated Birth Control:  other/unsure PP Procedures needed: 2 hour GTT  Schedule  Integrated BH visit: no      Newborn Data: Live born female  Birth Weight: 5 lb 13.5 oz (2650 g) APGAR: 9, 9  Newborn Delivery   Birth date/time:  09/04/2018 07:01:00 Delivery type:  C-Section, Low Transverse Trial of labor:  No C-section categorization:  Repeat     Baby Feeding: Breast Disposition:home with mother   09/06/2018 Aura Camps, MD

## 2018-09-09 ENCOUNTER — Encounter (HOSPITAL_COMMUNITY)
Admission: RE | Admit: 2018-09-09 | Discharge: 2018-09-09 | Disposition: A | Discharge status: Home / Self Care | Payer: Medicaid Other | Source: Ambulatory Visit | Attending: Obstetrics & Gynecology | Admitting: Obstetrics & Gynecology

## 2018-09-10 ENCOUNTER — Other Ambulatory Visit: Payer: Self-pay | Admitting: Family Medicine

## 2018-09-10 DIAGNOSIS — O24410 Gestational diabetes mellitus in pregnancy, diet controlled: Secondary | ICD-10-CM

## 2018-09-19 ENCOUNTER — Ambulatory Visit (INDEPENDENT_AMBULATORY_CARE_PROVIDER_SITE_OTHER): Payer: Medicaid Other | Admitting: *Deleted

## 2018-09-19 VITALS — BP 113/68 | HR 77 | Temp 98.1°F | Wt 146.7 lb

## 2018-09-19 DIAGNOSIS — Z5189 Encounter for other specified aftercare: Secondary | ICD-10-CM

## 2018-09-19 NOTE — Progress Notes (Signed)
Here for wound check. Had c/s 09/04/18. Wound intact with steristrips. Steristrips gently removed.  Near center 1 cm pink edge noted.  Asked Dr. Debroah LoopArnold to check area. Dr.Arnold in, no orders. Instructed patient to keep wound clean and dry, check wound daily, call us for appt if any issues or go to mau.  Joyce CopaLinda Z, RN

## 2018-09-23 ENCOUNTER — Other Ambulatory Visit: Payer: Self-pay | Admitting: Obstetrics & Gynecology

## 2018-09-23 DIAGNOSIS — E038 Other specified hypothyroidism: Secondary | ICD-10-CM

## 2018-10-10 ENCOUNTER — Other Ambulatory Visit: Payer: Self-pay | Admitting: *Deleted

## 2018-10-10 DIAGNOSIS — O24429 Gestational diabetes mellitus in childbirth, unspecified control: Secondary | ICD-10-CM

## 2018-10-11 ENCOUNTER — Encounter: Payer: Self-pay | Admitting: Student

## 2018-10-11 ENCOUNTER — Other Ambulatory Visit: Payer: Medicaid Other

## 2018-10-11 ENCOUNTER — Ambulatory Visit (INDEPENDENT_AMBULATORY_CARE_PROVIDER_SITE_OTHER): Payer: Medicaid Other | Admitting: Student

## 2018-10-11 ENCOUNTER — Ambulatory Visit: Payer: Self-pay | Admitting: Advanced Practice Midwife

## 2018-10-11 DIAGNOSIS — O24429 Gestational diabetes mellitus in childbirth, unspecified control: Secondary | ICD-10-CM

## 2018-10-11 DIAGNOSIS — Z1389 Encounter for screening for other disorder: Secondary | ICD-10-CM | POA: Diagnosis not present

## 2018-10-11 DIAGNOSIS — O0990 Supervision of high risk pregnancy, unspecified, unspecified trimester: Secondary | ICD-10-CM

## 2018-10-11 NOTE — Progress Notes (Signed)
Subjective:     Ashley Rhodes is a 32 y.o. female who presents for a postpartum visit. She is 5 weeks postpartum following a low cervical transverse Cesarean section. I have fully reviewed the prenatal and intrapartum course. The delivery was at 2336w1d gestational weeks. Outcome: repeat cesarean section, low transverse incision. Anesthesia: spinal. Postpartum course has been uneventful. Baby's course has been uneventful. Baby is feeding by breast. Bleeding no bleeding. Bowel function is normal. Bladder function is normal. Patient is not sexually active. Contraception method is none. Postpartum depression screening: negative.  The following portions of the patient's history were reviewed and updated as appropriate: allergies, current medications, past family history, past medical history, past social history, past surgical history and problem list.  Review of Systems Pertinent items are noted in HPI.   Objective:    BP 118/79   Pulse 91   Wt 150 lb 1.6 oz (68.1 kg)   BMI 30.32 kg/m   General:  alert, cooperative and no distress   Breasts:  inspection negative, no nipple discharge or bleeding, no masses or nodularity palpable  Lungs: clear to auscultation bilaterally  Heart:  regular rate and rhythm, S1, S2 normal, no murmur, click, rub or gallop  Abdomen: soft, non-tender; bowel sounds normal; no masses,  no organomegaly. Incision is still healing, some areas of redness but no tenderness, pus or drainage.    Vulva:  not evaluated  Vagina: not evaluated  Cervix:  not evaluated.   Corpus: not examined  Adnexa:  not evaluated  Rectal Exam: Not performed.        Assessment:    Healthy  postpartum exam. Pap smear not done at today's visit.   Plan:    1. Contraception: undecided.  Counseled patient that she should wait at least 2 years before getting pregnant due to repeat C-sections; she says she will discuss with her husband.  2. Patient in the process of her 2 hour GTT.  3.  Follow up in: PRN or as needed. or as needed.

## 2018-10-11 NOTE — Patient Instructions (Signed)
Type 2 Diabetes Mellitus, Self Care, Adult When you have type 2 diabetes (type 2 diabetes mellitus), you must keep your blood sugar (glucose) under control. You can do this with:  Nutrition.  Exercise.  Lifestyle changes.  Medicines or insulin, if needed.  Support from your doctors and others.  How do I manage my blood sugar?  Check your blood sugar level every day, as often as told.  Call your doctor if your blood sugar is above your goal numbers for 2 tests in a row.  Have your A1c (hemoglobin A1c) level checked at least two times a year. Have it checked more often if your doctor tells you to. Your doctor will set treatment goals for you. Generally, you should have these blood sugar levels:  Before meals (preprandial): 80-130 mg/dL (4.4-7.2 mmol/L).  After meals (postprandial): lower than 180 mg/dL (10 mmol/L).  A1c level: less than 7%.  What do I need to know about high blood sugar? High blood sugar is called hyperglycemia. Know the signs of high blood sugar. Signs may include:  Feeling: ? Thirsty. ? Hungry. ? Very tired.  Needing to pee (urinate) more than usual.  Blurry vision.  What do I need to know about low blood sugar? Low blood sugar is called hypoglycemia. This is when blood sugar is at or below 70 mg/dL (3.9 mmol/L). Symptoms may include:  Feeling: ? Hungry. ? Worried or nervous (anxious). ? Sweaty and clammy. ? Confused. ? Dizzy. ? Sleepy. ? Sick to your stomach (nauseous).  Having: ? A fast heartbeat (palpitations). ? A headache. ? A change in your vision. ? Jerky movements that you cannot control (seizure). ? Nightmares. ? Tingling or no feeling (numbness) around the mouth, lips, or tongue.  Having trouble with: ? Talking. ? Paying attention (concentrating). ? Moving (coordination). ? Sleeping.  Shaking.  Passing out (fainting).  Getting upset easily (irritability).  Treating low blood sugar  To treat low blood sugar, eat or  drink something sugary right away. If you can think clearly and swallow safely, follow the 15:15 rule:  Take 15 grams of a fast-acting carb (carbohydrate). Some fast-acting carbs are: ? 1 tube of glucose gel. ? 3 sugar tablets (glucose pills). ? 6-8 pieces of hard candy. ? 4 oz (120 mL) of fruit juice. ? 4 oz (120 mL) regular (not diet) soda.  Check your blood sugar 15 minutes after you take the carb.  If your blood sugar is still at or below 70 mg/dL (3.9 mmol/L), take 15 grams of a carb again.  If your blood sugar does not go above 70 mg/dL (3.9 mmol/L) after 3 tries, get help right away.  After your blood sugar goes back to normal, eat a meal or a snack within 1 hour.  Treating very low blood sugar If your blood sugar is at or below 54 mg/dL (3 mmol/L), you have very low blood sugar (severe hypoglycemia). This is an emergency. Do not wait to see if the symptoms will go away. Get medical help right away. Call your local emergency services (911 in the [place] Do not drive yourself to the hospital. If you have very low blood sugar and you cannot eat or drink, you may need a glucagon shot (injection). A family member or friend should learn how to check your blood sugar and how to give you a glucagon shot. Ask your doctor if you need to have a glucagon shot kit at home. What else is important to manage my diabetes? Medicine  Follow these instructions about insulin and diabetes medicines:  Take them as told by your doctor.  Adjust them as told by your doctor.  Do not run out of them.  Having diabetes can raise your risk for other long-term conditions. These include heart or kidney disease. Your doctor may prescribe medicines to help prevent problems from diabetes. Food   Make healthy food choices. These include: ? Chicken, fish, egg whites, and beans. ? Oats, whole wheat, bulgur, brown rice, quinoa, and millet. ? Fresh fruits and vegetables. ? Low-fat dairy products. ? Nuts,  avocado, olive oil, and canola oil.  Make a food plan with a specialist (dietitian).  Follow instructions from your doctor about what you cannot eat or drink.  Drink enough fluid to keep your pee (urine) clear or pale yellow.  Eat healthy snacks between healthy meals.  Keep track of carbs that you eat. Read food labels. Learn food serving sizes.  Follow your sick day plan when you cannot eat or drink normally. Make this plan with your doctor so it is ready to use. Activity  Exercise at least 3 times a week.  Do not go more than 2 days without exercising.  Talk with your doctor before you start a new exercise. Your doctor may need to adjust your insulin, medicines, or food. Lifestyle   Do not use any tobacco products. These include cigarettes, chewing tobacco, and e-cigarettes.If you need help quitting, ask your doctor.  Ask your doctor how much alcohol is safe for you.  Learn to deal with stress. If you need help with this, ask your doctor. Body care  Stay up to date with your shots (immunizations).  Have your eyes and feet checked by a doctor as often as told.  Check your skin and feet every day. Check for cuts, bruises, redness, blisters, or sores.  Brush your teeth and gums two times a day.  Floss at least one time a day.  Go to the dentist least one time every 6 months.  Stay at a healthy weight. General instructions   Take over-the-counter and prescription medicines only as told by your doctor.  Share your diabetes care plan with: ? Your work or school. ? People you live with.  Check your pee (urine) for ketones: ? When you are sick. ? As told by your doctor.  Carry a card or wear jewelry that says that you have diabetes.  Ask your doctor: ? Do I need to meet with a diabetes educator? ? Where can I find a support group for people with diabetes?  Keep all follow-up visits as told by your doctor. This is important. Where to find more information: To  learn more about diabetes, visit:  American Diabetes Association: www.diabetes.org  American Association of Diabetes Educators: www.diabeteseducator.org/patient-resources  This information is not intended to replace advice given to you by your health care provider. Make sure you discuss any questions you have with your health care provider. Document Released: 02/03/2016 Document Revised: 03/19/2016 Document Reviewed: 11/15/2015 Elsevier Interactive Patient Education  [employer].

## 2018-10-12 ENCOUNTER — Encounter: Payer: Self-pay | Admitting: Student

## 2018-10-12 ENCOUNTER — Telehealth: Payer: Self-pay

## 2018-10-12 DIAGNOSIS — E119 Type 2 diabetes mellitus without complications: Secondary | ICD-10-CM | POA: Insufficient documentation

## 2018-10-12 LAB — GLUCOSE TOLERANCE, 2 HOURS
GLUCOSE, 2 HOUR: 197 mg/dL — AB (ref 65–139)
Glucose, GTT - Fasting: 86 mg/dL (ref 65–99)

## 2018-10-12 NOTE — Telephone Encounter (Signed)
-----   Message from Marylene LandKathryn Lorraine Kooistra, CNM sent at 10/12/2018 10:40 AM EST ----- Hello! Do you mind to call this patient and tell her that she did not pass her GTT; she is diabetic and needs to begin care with a Primary Care doctor. [facility]. Thank you!

## 2018-10-12 NOTE — Telephone Encounter (Signed)
Called pt to advise that she tested positive for GTT test & she will need to follow up with a PCP, [facility] . Gave her # to call & schedule an appt. With them.No answer, left VM.

## 2018-10-20 NOTE — Telephone Encounter (Signed)
Thank you for letting me know

## 2018-11-01 ENCOUNTER — Other Ambulatory Visit: Payer: Self-pay | Admitting: Obstetrics & Gynecology

## 2018-11-01 DIAGNOSIS — E038 Other specified hypothyroidism: Secondary | ICD-10-CM

## 2020-03-04 IMAGING — US US FETAL BPP W/ NON-STRESS
1 series · 13 of 13 positions shown · non-contrast
Comparison: none

[Series 1: us fetal bpp w/nonstress · 13 acquisitions, 13 frames shown]
[im 1/13]
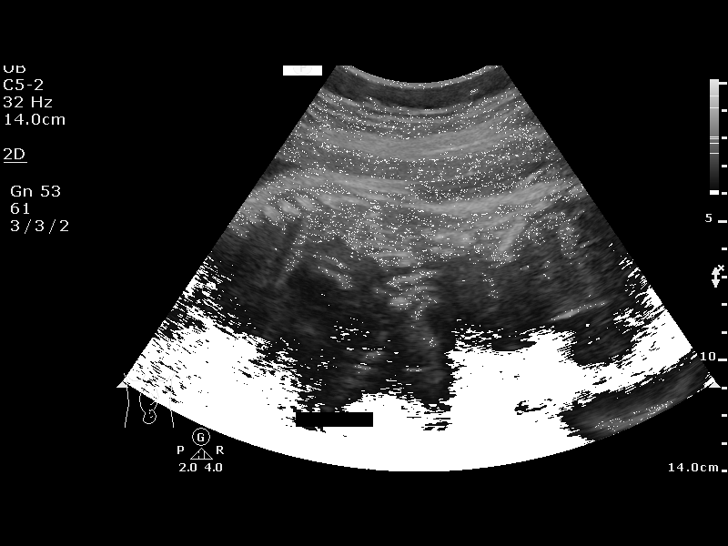
[im 2/13]
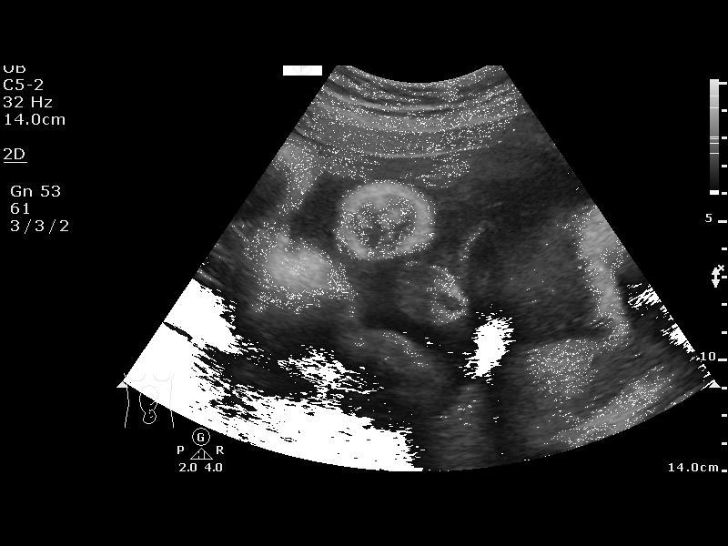
[im 3/13]
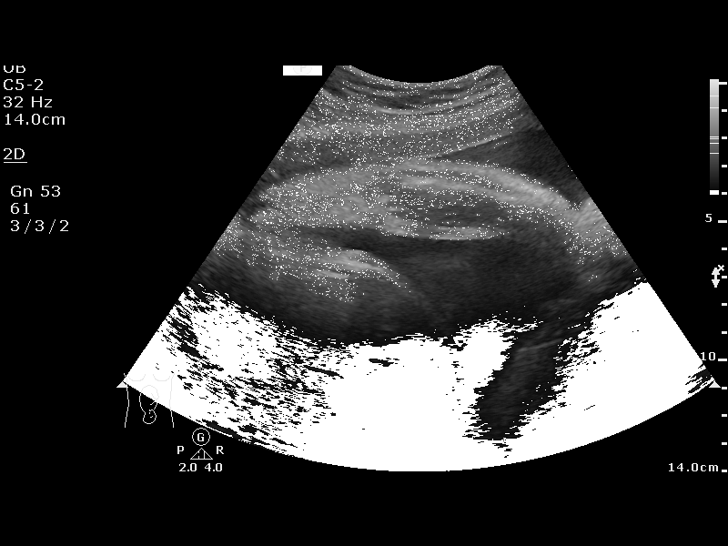
[im 4/13]
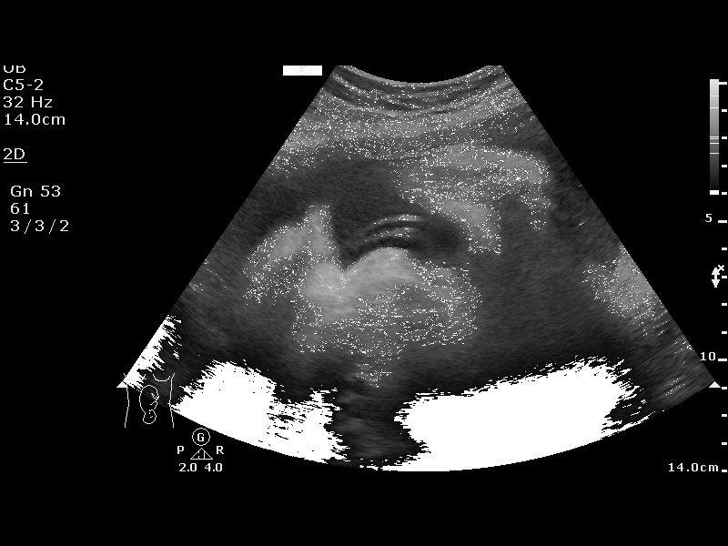
[im 5/13]
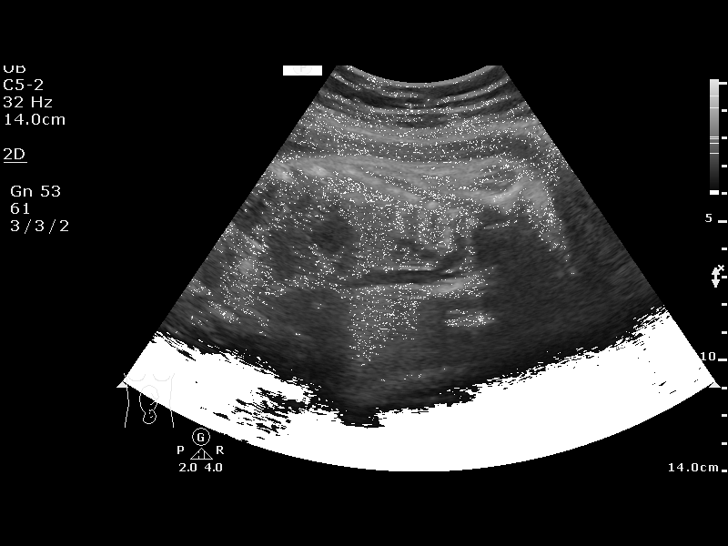
[im 6/13]
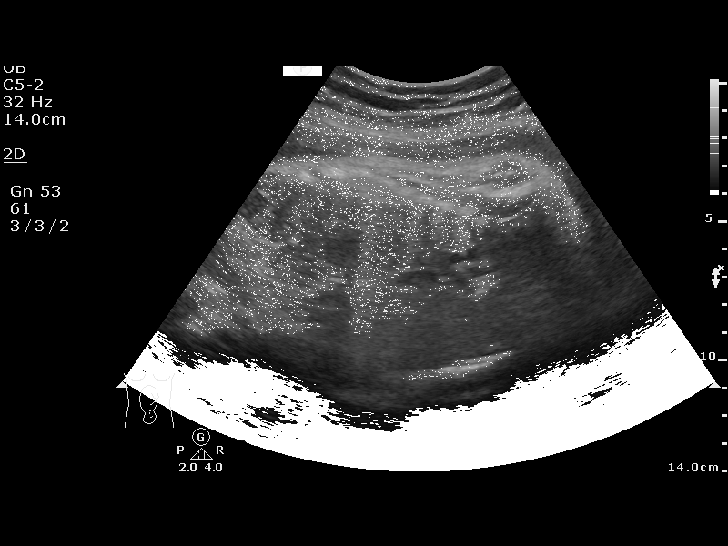
[im 7/13]
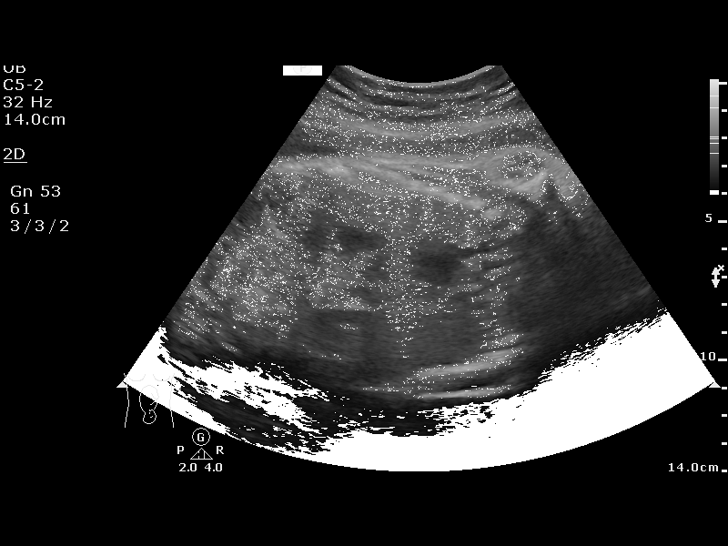
[im 8/13]
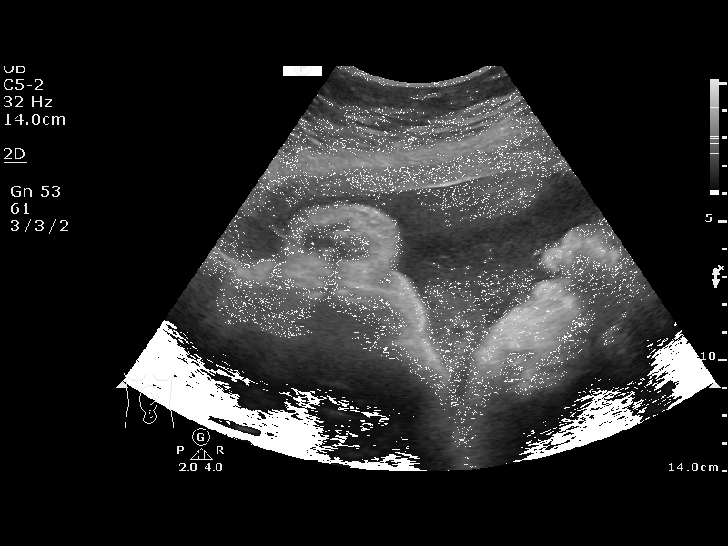
[im 9/13]
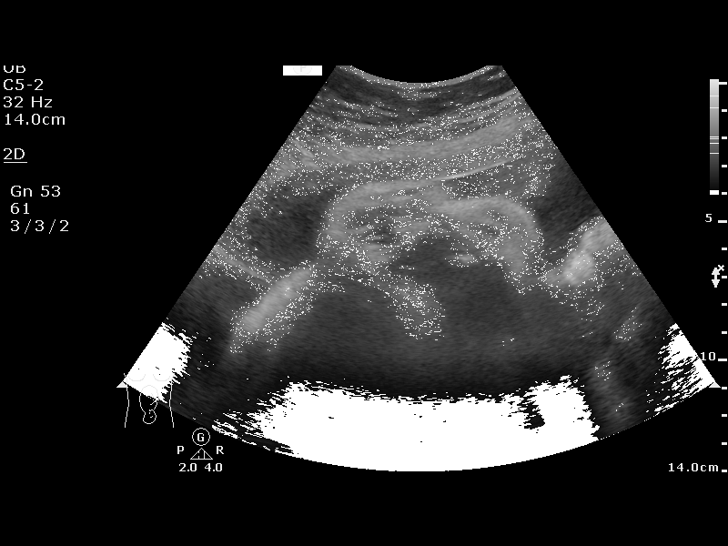
[im 10/13]
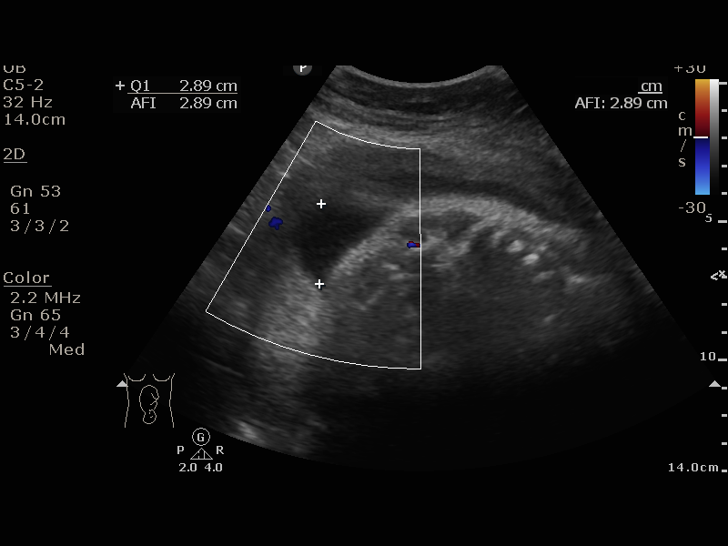
[im 11/13]
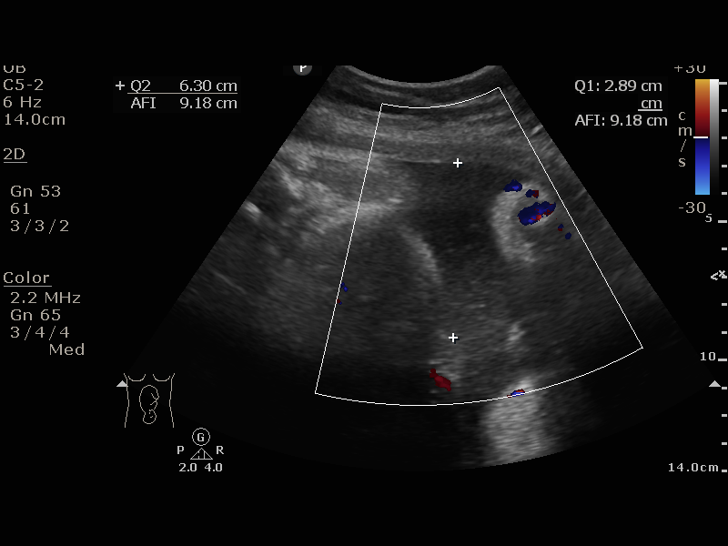
[im 12/13]
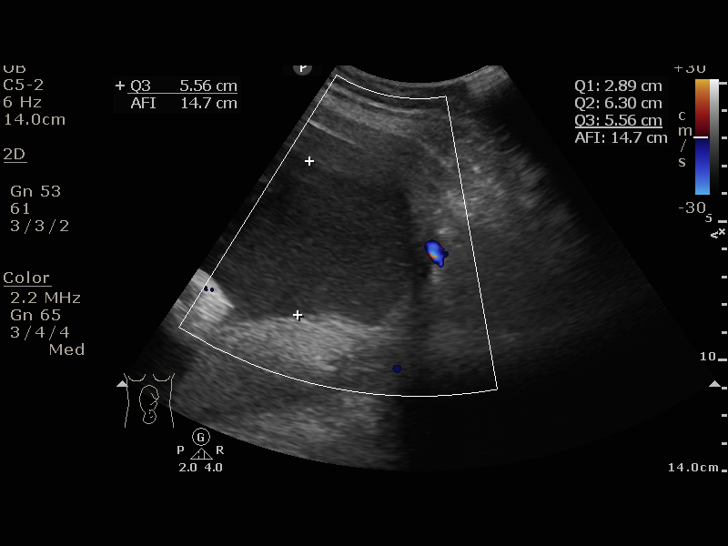
[im 13/13]
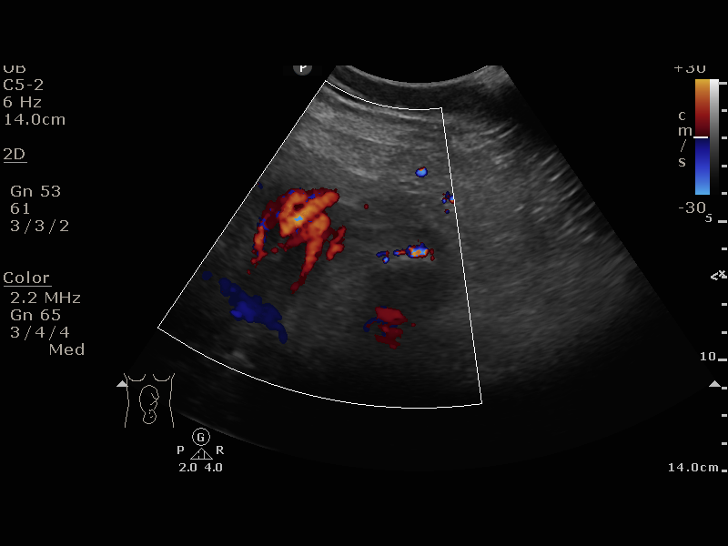

[13 of 13 positions shown; findings below may reference images not displayed]

[facility]

[facility]
[REDACTED]

Service(s) Provided

Indications

36 weeks gestation of pregnancy
Gestational diabetes in pregnancy,
controlled by oral hypoglycemic drugs
Fetal Evaluation

Num Of Fetuses:         1
Preg. Location:         Intrauterine
Cardiac Activity:       Observed
Presentation:           Cephalic

Amniotic Fluid
AFI FV:      Within normal limits

AFI Sum(cm)     %Tile       Largest Pocket(cm)
14.75           54

RUQ(cm)       RLQ(cm)       LUQ(cm)        LLQ(cm)
2.89          0
Biophysical Evaluation
Amniotic F.V:   Pocket => 2 cm two         F. Tone:        Observed
planes
F. Movement:    Observed                   N.S.T:          Reactive
F. Breathing:   Observed                   Score:          [DATE]
OB History

Gravidity:    6         Term:   1        Prem:   0        SAB:   4
TOP:          0       Ectopic:  0        Living: 1
Gestational Age

LMP:           36w 2d        Date:  12/11/17                 EDD:   09/17/18
Best:          36w 2d     Det. By:  LMP  (12/11/17)          EDD:   09/17/18
Impression

Recommendations

Continue antenatal testing
Klothilde Burban, DO
